# Patient Record
Sex: Male | Born: 1935
Health system: Southern US, Community
[De-identification: ages and names within clinical notes are randomized; demographics above are authoritative.]

## PROBLEM LIST (undated history)

## (undated) DIAGNOSIS — S48919A Complete traumatic amputation of unspecified shoulder and upper arm, level unspecified, initial encounter: Secondary | ICD-10-CM

## (undated) DIAGNOSIS — Z9001 Acquired absence of eye: Secondary | ICD-10-CM

## (undated) DIAGNOSIS — N189 Chronic kidney disease, unspecified: Secondary | ICD-10-CM

## (undated) DIAGNOSIS — R011 Cardiac murmur, unspecified: Secondary | ICD-10-CM

## (undated) DIAGNOSIS — I10 Essential (primary) hypertension: Secondary | ICD-10-CM

## (undated) HISTORY — PX: TONSILECTOMY, ADENOIDECTOMY, BILATERAL MYRINGOTOMY AND TUBES: SHX2538

## (undated) HISTORY — PX: OTHER SURGICAL HISTORY: SHX169

## (undated) HISTORY — PX: APPENDECTOMY: SHX54

## (undated) HISTORY — DX: Complete traumatic amputation of unspecified shoulder and upper arm, level unspecified, initial encounter: S48.919A

## (undated) HISTORY — DX: Cardiac murmur, unspecified: R01.1

## (undated) HISTORY — DX: Chronic kidney disease, unspecified: N18.9

## (undated) HISTORY — DX: Essential (primary) hypertension: I10

## (undated) HISTORY — DX: Acquired absence of eye: Z90.01

---

## 1998-05-19 ENCOUNTER — Ambulatory Visit (HOSPITAL_COMMUNITY): Admission: RE | Admit: 1998-05-19 | Discharge: 1998-05-19 | Payer: Self-pay | Admitting: Family Medicine

## 2002-03-22 ENCOUNTER — Encounter: Payer: Self-pay | Admitting: Family Medicine

## 2002-03-22 ENCOUNTER — Encounter: Admission: RE | Admit: 2002-03-22 | Discharge: 2002-03-22 | Payer: Self-pay | Admitting: Family Medicine

## 2008-09-24 ENCOUNTER — Emergency Department (HOSPITAL_COMMUNITY): Admission: EM | Admit: 2008-09-24 | Discharge: 2008-09-24 | Payer: Self-pay | Admitting: Emergency Medicine

## 2009-10-25 ENCOUNTER — Ambulatory Visit: Payer: Self-pay | Admitting: Cardiovascular Disease

## 2010-04-30 ENCOUNTER — Ambulatory Visit (INDEPENDENT_AMBULATORY_CARE_PROVIDER_SITE_OTHER): Payer: Medicare Other | Admitting: Cardiovascular Disease

## 2010-04-30 DIAGNOSIS — I119 Hypertensive heart disease without heart failure: Secondary | ICD-10-CM

## 2010-04-30 DIAGNOSIS — E7800 Pure hypercholesterolemia, unspecified: Secondary | ICD-10-CM

## 2011-05-14 ENCOUNTER — Other Ambulatory Visit: Payer: Self-pay | Admitting: Cardiovascular Disease

## 2011-05-22 DIAGNOSIS — I10 Essential (primary) hypertension: Secondary | ICD-10-CM | POA: Insufficient documentation

## 2011-05-22 DIAGNOSIS — Z9001 Acquired absence of eye: Secondary | ICD-10-CM | POA: Insufficient documentation

## 2011-05-22 DIAGNOSIS — R011 Cardiac murmur, unspecified: Secondary | ICD-10-CM

## 2011-05-22 DIAGNOSIS — IMO0001 Reserved for inherently not codable concepts without codable children: Secondary | ICD-10-CM | POA: Insufficient documentation

## 2011-05-22 DIAGNOSIS — N289 Disorder of kidney and ureter, unspecified: Secondary | ICD-10-CM

## 2011-05-22 DIAGNOSIS — S48919A Complete traumatic amputation of unspecified shoulder and upper arm, level unspecified, initial encounter: Secondary | ICD-10-CM

## 2011-06-20 ENCOUNTER — Other Ambulatory Visit: Payer: Self-pay | Admitting: Cardiovascular Disease

## 2011-06-20 NOTE — Telephone Encounter (Signed)
Refilled carvedilol one time

## 2011-07-24 ENCOUNTER — Encounter: Payer: Self-pay | Admitting: Cardiovascular Disease

## 2011-07-24 ENCOUNTER — Ambulatory Visit (INDEPENDENT_AMBULATORY_CARE_PROVIDER_SITE_OTHER): Payer: Medicare Other | Admitting: Cardiovascular Disease

## 2011-07-24 ENCOUNTER — Other Ambulatory Visit: Payer: Self-pay

## 2011-07-24 VITALS — BP 132/68 | HR 54 | Ht 70.0 in | Wt 186.8 lb

## 2011-07-24 DIAGNOSIS — I10 Essential (primary) hypertension: Secondary | ICD-10-CM

## 2011-07-24 DIAGNOSIS — E785 Hyperlipidemia, unspecified: Secondary | ICD-10-CM

## 2011-07-24 LAB — BASIC METABOLIC PANEL
CO2: 26 mEq/L (ref 19–32)
Calcium: 9.6 mg/dL (ref 8.4–10.5)
Creatinine, Ser: 1.2 mg/dL (ref 0.4–1.5)
Glucose, Bld: 142 mg/dL — ABNORMAL HIGH (ref 70–99)

## 2011-07-24 LAB — HEPATIC FUNCTION PANEL
AST: 22 U/L (ref 0–37)
Total Bilirubin: 0.8 mg/dL (ref 0.3–1.2)

## 2011-07-24 LAB — LIPID PANEL
HDL: 40.8 mg/dL (ref >39.00)
Total CHOL/HDL Ratio: 3

## 2011-07-24 MED ORDER — CARVEDILOL 12.5 MG PO TABS: 12.5000 mg | ORAL_TABLET | Freq: Two times a day (BID) | ORAL | Status: DC

## 2011-07-24 MED ORDER — SIMVASTATIN 40 MG PO TABS: 40.0000 mg | ORAL_TABLET | Freq: Every day | ORAL | Status: DC

## 2011-07-24 MED ORDER — SIMVASTATIN 80 MG PO TABS: 80.0000 mg | ORAL_TABLET | Freq: Every day | ORAL | Status: DC

## 2011-07-24 MED ORDER — LOSARTAN POTASSIUM-HCTZ 100-25 MG PO TABS: 1.0000 | ORAL_TABLET | Freq: Every day | ORAL | Status: DC

## 2011-07-24 NOTE — Assessment & Plan Note (Signed)
Ray Walters is doing fairly well. His blood pressure is a little elevated today. We'll give him some instructions on at the [facility] diet. We'll continue the same medications.  I'll see him again in 6 months. We'll check labs on him at that time.
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
## 2011-07-24 NOTE — Assessment & Plan Note (Signed)
He is still on simvastatin 80 mg a day. We will decrease his dose to 40 mg a day since that is now the highest recommended daily dose of simvastatin.

## 2011-07-24 NOTE — Patient Instructions (Signed)
Your physician wants you to follow-up in: 6 months with Dr. Melburn Popper.  You will receive a reminder letter in the mail two months in advance. If you don't receive a letter, please call our office to schedule the follow-up appointment.\  Decrease Simvastatin to 40mg  daily.  Labs today and in 6 months.  DASH Diet The DASH diet stands for "Dietary Approaches to Stop Hypertension." It is a healthy eating plan that has been shown to reduce high blood pressure (hypertension) in as little as 14 days, while also possibly providing other significant health benefits. These other health benefits include reducing the risk of breast cancer after menopause and reducing the risk of type 2 diabetes, heart disease, colon cancer, and stroke. Health benefits also include weight loss and slowing kidney failure in patients with chronic kidney disease.  DIET GUIDELINES  Limit salt (sodium). Your diet should contain less than 1500 mg of sodium daily.   Limit refined or processed carbohydrates. Your diet should include mostly whole grains. Desserts and added sugars should be used sparingly.   Include small amounts of heart-healthy fats. These types of fats include nuts, oils, and tub margarine. Limit saturated and trans fats. These fats have been shown to be harmful in the body.  CHOOSING FOODS  The following food groups are based on a 2000 calorie diet. See your Registered Dietitian for individual calorie needs. Grains and Grain Products (6 to 8 servings daily)  Eat More Often: Whole-wheat bread, brown rice, whole-grain or wheat pasta, quinoa, popcorn without added fat or salt (air popped).   Eat Less Often: White bread, white pasta, white rice, cornbread.  Vegetables (4 to 5 servings daily)  Eat More Often: Fresh, frozen, and canned vegetables. Vegetables may be raw, steamed, roasted, or grilled with a minimal amount of fat.   Eat Less Often/Avoid: Creamed or fried vegetables. Vegetables in a cheese sauce.  Fruit  (4 to 5 servings daily)  Eat More Often: All fresh, canned (in natural juice), or frozen fruits. Dried fruits without added sugar. One hundred percent fruit juice ( cup [237 mL] daily).   Eat Less Often: Dried fruits with added sugar. Canned fruit in light or heavy syrup.  [employer], Fish, and Poultry (2 servings or less daily. One serving is 3 to 4 oz [85-114 g]).  Eat More Often: Ninety percent or leaner ground beef, tenderloin, sirloin. Round cuts of beef, chicken breast, [place] breast. All fish. Grill, bake, or broil your meat. Nothing should be fried.   Eat Less Often/Avoid: Fatty cuts of meat, [place], or chicken leg, thigh, or wing. Fried cuts of meat or fish.  Dairy (2 to 3 servings)  Eat More Often: Low-fat or fat-free milk, low-fat plain or light yogurt, reduced-fat or part-skim cheese.   Eat Less Often/Avoid: Milk (whole, 2%, skim, or chocolate).Whole milk yogurt. Full-fat cheeses.  Nuts, Seeds, and Legumes (4 to 5 servings per week)  Eat More Often: All without added salt.   Eat Less Often/Avoid: Salted nuts and seeds, canned beans with added salt.  Fats and Sweets (limited)  Eat More Often: Vegetable oils, tub margarines without trans fats, sugar-free gelatin. Mayonnaise and salad dressings.   Eat Less Often/Avoid: Coconut oils, palm oils, butter, stick margarine, cream, half and half, cookies, candy, pie.  FOR MORE INFORMATION The Dash Diet Eating Plan: www.dashdiet.org Document Released: 02/14/2011 Document Reviewed: 02/04/2011 [facility] Patient Information 2012 [place], [place].

## 2011-08-14 ENCOUNTER — Encounter: Payer: Self-pay | Admitting: Cardiovascular Disease

## 2012-07-15 ENCOUNTER — Other Ambulatory Visit: Payer: Self-pay | Admitting: *Deleted

## 2012-07-15 MED ORDER — LOSARTAN POTASSIUM-HCTZ 100-25 MG PO TABS: 1.0000 | ORAL_TABLET | Freq: Every day | ORAL | Status: DC

## 2012-07-15 MED ORDER — SIMVASTATIN 40 MG PO TABS: 40.0000 mg | ORAL_TABLET | Freq: Every day | ORAL | Status: DC

## 2012-07-15 NOTE — Telephone Encounter (Signed)
Fax Received. Refill Completed. Ray Walters (R.M.A)   

## 2012-09-09 ENCOUNTER — Ambulatory Visit (INDEPENDENT_AMBULATORY_CARE_PROVIDER_SITE_OTHER): Payer: Medicare Other | Admitting: Cardiovascular Disease

## 2012-09-09 ENCOUNTER — Encounter: Payer: Self-pay | Admitting: Cardiovascular Disease

## 2012-09-09 VITALS — BP 138/76 | HR 64 | Ht 70.0 in | Wt 189.4 lb

## 2012-09-09 DIAGNOSIS — I10 Essential (primary) hypertension: Secondary | ICD-10-CM

## 2012-09-09 MED ORDER — LOSARTAN POTASSIUM-HCTZ 100-25 MG PO TABS: 1.0000 | ORAL_TABLET | Freq: Every day | ORAL | Status: DC

## 2012-09-09 MED ORDER — CARVEDILOL 12.5 MG PO TABS: 12.5000 mg | ORAL_TABLET | Freq: Two times a day (BID) | ORAL | Status: AC
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
## 2012-09-09 NOTE — Assessment & Plan Note (Signed)
Ray Walters is doing well.  No CP , breathing is good.  He has been watching his salt and is exercising regularly.  Will see him in 1 year for OV, labs, ECG.

## 2012-09-09 NOTE — Patient Instructions (Addendum)
Your physician wants you to follow-up in: 1 YEAR You will receive a reminder letter in the mail two months in advance. If you don't receive a letter, please call our office to schedule the follow-up appointment.  Your physician recommends that you continue on your current medications as directed. Please refer to the Current Medication list given to you today.   Your physician recommends that you return for a FASTING lipid profile: 1 YEAR  

## 2013-05-07 ENCOUNTER — Other Ambulatory Visit: Payer: Self-pay | Admitting: Cardiovascular Disease

## 2013-05-25 ENCOUNTER — Other Ambulatory Visit: Payer: Self-pay | Admitting: *Deleted

## 2013-05-25 MED ORDER — LOSARTAN POTASSIUM-HCTZ 100-25 MG PO TABS: 1.0000 | ORAL_TABLET | Freq: Every day | ORAL | Status: AC

## 2013-09-17 ENCOUNTER — Ambulatory Visit (INDEPENDENT_AMBULATORY_CARE_PROVIDER_SITE_OTHER): Payer: Medicare HMO | Admitting: Cardiovascular Disease

## 2013-09-17 ENCOUNTER — Encounter: Payer: Self-pay | Admitting: Cardiovascular Disease

## 2013-09-17 VITALS — BP 138/72 | HR 64 | Ht 70.0 in | Wt 189.2 lb

## 2013-09-17 DIAGNOSIS — E785 Hyperlipidemia, unspecified: Secondary | ICD-10-CM

## 2013-09-17 DIAGNOSIS — I10 Essential (primary) hypertension: Secondary | ICD-10-CM

## 2013-09-17 NOTE — Patient Instructions (Signed)
Your physician recommends that you continue on your current medications as directed. Please refer to the Current Medication list given to you today.  Your physician wants you to follow-up in: 1 year with Dr. Nahser.  You will receive a reminder letter in the mail two months in advance. If you don't receive a letter, please call our office to schedule the follow-up appointment.  
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
## 2013-09-17 NOTE — Assessment & Plan Note (Signed)
His blood pressure seems to be fairly well controlled. Continue same medications.

## 2015-01-03 DIAGNOSIS — E119 Type 2 diabetes mellitus without complications: Secondary | ICD-10-CM | POA: Diagnosis not present

## 2015-01-30 DIAGNOSIS — E119 Type 2 diabetes mellitus without complications: Secondary | ICD-10-CM | POA: Diagnosis not present

## 2015-01-30 DIAGNOSIS — E559 Vitamin D deficiency, unspecified: Secondary | ICD-10-CM | POA: Diagnosis not present

## 2015-01-30 DIAGNOSIS — R7989 Other specified abnormal findings of blood chemistry: Secondary | ICD-10-CM | POA: Diagnosis not present

## 2015-01-30 DIAGNOSIS — E290 Testicular hyperfunction: Secondary | ICD-10-CM | POA: Diagnosis not present

## 2015-01-30 DIAGNOSIS — R972 Elevated prostate specific antigen [PSA]: Secondary | ICD-10-CM | POA: Diagnosis not present

## 2015-01-30 DIAGNOSIS — R5383 Other fatigue: Secondary | ICD-10-CM | POA: Diagnosis not present

## 2015-02-07 DIAGNOSIS — E119 Type 2 diabetes mellitus without complications: Secondary | ICD-10-CM | POA: Diagnosis not present

## 2015-02-21 DIAGNOSIS — Z1389 Encounter for screening for other disorder: Secondary | ICD-10-CM | POA: Diagnosis not present

## 2015-02-21 DIAGNOSIS — I10 Essential (primary) hypertension: Secondary | ICD-10-CM | POA: Diagnosis not present

## 2015-02-21 DIAGNOSIS — E785 Hyperlipidemia, unspecified: Secondary | ICD-10-CM | POA: Diagnosis not present

## 2015-02-21 DIAGNOSIS — Z7984 Long term (current) use of oral hypoglycemic drugs: Secondary | ICD-10-CM | POA: Diagnosis not present

## 2015-02-21 DIAGNOSIS — Z0000 Encounter for general adult medical examination without abnormal findings: Secondary | ICD-10-CM | POA: Diagnosis not present

## 2015-02-21 DIAGNOSIS — E1122 Type 2 diabetes mellitus with diabetic chronic kidney disease: Secondary | ICD-10-CM | POA: Diagnosis not present

## 2015-02-21 DIAGNOSIS — N183 Chronic kidney disease, stage 3 (moderate): Secondary | ICD-10-CM | POA: Diagnosis not present

## 2015-04-26 DIAGNOSIS — E119 Type 2 diabetes mellitus without complications: Secondary | ICD-10-CM | POA: Diagnosis not present

## 2015-05-02 DIAGNOSIS — E782 Mixed hyperlipidemia: Secondary | ICD-10-CM | POA: Diagnosis not present

## 2015-05-02 DIAGNOSIS — E119 Type 2 diabetes mellitus without complications: Secondary | ICD-10-CM | POA: Diagnosis not present

## 2015-05-02 DIAGNOSIS — E559 Vitamin D deficiency, unspecified: Secondary | ICD-10-CM | POA: Diagnosis not present

## 2015-05-02 DIAGNOSIS — R7989 Other specified abnormal findings of blood chemistry: Secondary | ICD-10-CM | POA: Diagnosis not present

## 2015-05-04 DIAGNOSIS — E119 Type 2 diabetes mellitus without complications: Secondary | ICD-10-CM | POA: Diagnosis not present

## 2015-05-15 DIAGNOSIS — H353121 Nonexudative age-related macular degeneration, left eye, early dry stage: Secondary | ICD-10-CM | POA: Diagnosis not present

## 2015-05-15 DIAGNOSIS — H43812 Vitreous degeneration, left eye: Secondary | ICD-10-CM | POA: Diagnosis not present

## 2015-05-15 DIAGNOSIS — E119 Type 2 diabetes mellitus without complications: Secondary | ICD-10-CM | POA: Diagnosis not present

## 2015-05-25 DIAGNOSIS — E785 Hyperlipidemia, unspecified: Secondary | ICD-10-CM | POA: Diagnosis not present

## 2015-05-25 DIAGNOSIS — E119 Type 2 diabetes mellitus without complications: Secondary | ICD-10-CM | POA: Diagnosis not present

## 2015-05-25 DIAGNOSIS — I10 Essential (primary) hypertension: Secondary | ICD-10-CM | POA: Diagnosis not present

## 2015-05-30 ENCOUNTER — Emergency Department (HOSPITAL_BASED_OUTPATIENT_CLINIC_OR_DEPARTMENT_OTHER): Payer: Medicare HMO

## 2015-05-30 ENCOUNTER — Encounter (HOSPITAL_BASED_OUTPATIENT_CLINIC_OR_DEPARTMENT_OTHER): Payer: Self-pay | Admitting: Emergency Medicine

## 2015-05-30 ENCOUNTER — Other Ambulatory Visit: Payer: Self-pay

## 2015-05-30 ENCOUNTER — Emergency Department (HOSPITAL_BASED_OUTPATIENT_CLINIC_OR_DEPARTMENT_OTHER)
Admission: EM | Admit: 2015-05-30 | Discharge: 2015-05-31 | Disposition: A | Discharge status: Home / Self Care | Payer: Medicare HMO | Attending: Physician Assistant | Admitting: Physician Assistant

## 2015-05-30 DIAGNOSIS — H5441 Blindness, right eye, normal vision left eye: Secondary | ICD-10-CM | POA: Diagnosis not present

## 2015-05-30 DIAGNOSIS — R42 Dizziness and giddiness: Secondary | ICD-10-CM | POA: Diagnosis not present

## 2015-05-30 DIAGNOSIS — R7989 Other specified abnormal findings of blood chemistry: Secondary | ICD-10-CM

## 2015-05-30 DIAGNOSIS — Z7984 Long term (current) use of oral hypoglycemic drugs: Secondary | ICD-10-CM | POA: Insufficient documentation

## 2015-05-30 DIAGNOSIS — Z79899 Other long term (current) drug therapy: Secondary | ICD-10-CM | POA: Insufficient documentation

## 2015-05-30 DIAGNOSIS — I129 Hypertensive chronic kidney disease with stage 1 through stage 4 chronic kidney disease, or unspecified chronic kidney disease: Secondary | ICD-10-CM | POA: Insufficient documentation

## 2015-05-30 DIAGNOSIS — N189 Chronic kidney disease, unspecified: Secondary | ICD-10-CM | POA: Diagnosis not present

## 2015-05-30 DIAGNOSIS — Z7982 Long term (current) use of aspirin: Secondary | ICD-10-CM | POA: Insufficient documentation

## 2015-05-30 DIAGNOSIS — E876 Hypokalemia: Secondary | ICD-10-CM | POA: Insufficient documentation

## 2015-05-30 DIAGNOSIS — E119 Type 2 diabetes mellitus without complications: Secondary | ICD-10-CM | POA: Diagnosis not present

## 2015-05-30 DIAGNOSIS — Z87828 Personal history of other (healed) physical injury and trauma: Secondary | ICD-10-CM | POA: Diagnosis not present

## 2015-05-30 DIAGNOSIS — R011 Cardiac murmur, unspecified: Secondary | ICD-10-CM | POA: Insufficient documentation

## 2015-05-30 DIAGNOSIS — Z87891 Personal history of nicotine dependence: Secondary | ICD-10-CM | POA: Insufficient documentation

## 2015-05-30 LAB — BASIC METABOLIC PANEL
Anion gap: 10 (ref 5–15)
BUN: 27 mg/dL — AB (ref 6–20)
CHLORIDE: 101 mmol/L (ref 101–111)
CO2: 23 mmol/L (ref 22–32)
CREATININE: 1.72 mg/dL — AB (ref 0.61–1.24)
Calcium: 9.1 mg/dL (ref 8.9–10.3)
GFR calc Af Amer: 40 mL/min — ABNORMAL LOW (ref >60)
GFR calc non Af Amer: 34 mL/min — ABNORMAL LOW (ref >60)
GLUCOSE: 139 mg/dL — AB (ref 65–99)
Potassium: 3.3 mmol/L — ABNORMAL LOW (ref 3.5–5.1)
SODIUM: 134 mmol/L — AB (ref 135–145)

## 2015-05-30 LAB — CBC WITH DIFFERENTIAL/PLATELET
Basophils Absolute: 0 10*3/uL (ref 0.0–0.1)
Basophils Relative: 0 %
EOS ABS: 0.1 10*3/uL (ref 0.0–0.7)
Eosinophils Relative: 2 %
HEMATOCRIT: 36.1 % — AB (ref 39.0–52.0)
HEMOGLOBIN: 13 g/dL (ref 13.0–17.0)
LYMPHS ABS: 1.9 10*3/uL (ref 0.7–4.0)
Lymphocytes Relative: 30 %
MCH: 32.9 pg (ref 26.0–34.0)
MCHC: 36 g/dL (ref 30.0–36.0)
MCV: 91.4 fL (ref 78.0–100.0)
MONOS PCT: 7 %
Monocytes Absolute: 0.5 10*3/uL (ref 0.1–1.0)
NEUTROS PCT: 61 %
Neutro Abs: 3.8 10*3/uL (ref 1.7–7.7)
Platelets: 174 10*3/uL (ref 150–400)
RBC: 3.95 MIL/uL — ABNORMAL LOW (ref 4.22–5.81)
RDW: 11.7 % (ref 11.5–15.5)
WBC: 6.3 10*3/uL (ref 4.0–10.5)

## 2015-05-30 LAB — URINALYSIS, ROUTINE W REFLEX MICROSCOPIC
Bilirubin Urine: NEGATIVE
GLUCOSE, UA: NEGATIVE mg/dL
HGB URINE DIPSTICK: NEGATIVE
Ketones, ur: NEGATIVE mg/dL
Leukocytes, UA: NEGATIVE
Nitrite: NEGATIVE
PH: 5.5 (ref 5.0–8.0)
Protein, ur: NEGATIVE mg/dL
SPECIFIC GRAVITY, URINE: 1.008 (ref 1.005–1.030)

## 2015-05-30 LAB — TROPONIN I: Troponin I: <0.03 ng/mL (ref <0.031)

## 2015-05-30 NOTE — ED Notes (Signed)
Pt states he was working out earlier today and afterward felt weird. Took BP and it was low. Called PCP and was told to come to ED.
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
## 2015-05-30 NOTE — ED Notes (Signed)
Pt had episode of dizziness at 3:30 this afternoon after working out at the gym.  Took his bp when he got home and sts it was 105/35.  Episode passed in about 10 minutes and then it passed.  Called his pmd and they sent him to the Ed to be checked.  No untoward sx at this time.

## 2015-05-31 DIAGNOSIS — R42 Dizziness and giddiness: Secondary | ICD-10-CM | POA: Diagnosis not present

## 2015-05-31 DIAGNOSIS — Z87891 Personal history of nicotine dependence: Secondary | ICD-10-CM | POA: Diagnosis not present

## 2015-05-31 DIAGNOSIS — N189 Chronic kidney disease, unspecified: Secondary | ICD-10-CM | POA: Diagnosis not present

## 2015-05-31 DIAGNOSIS — H5441 Blindness, right eye, normal vision left eye: Secondary | ICD-10-CM | POA: Diagnosis not present

## 2015-05-31 DIAGNOSIS — E119 Type 2 diabetes mellitus without complications: Secondary | ICD-10-CM | POA: Diagnosis not present

## 2015-05-31 DIAGNOSIS — I129 Hypertensive chronic kidney disease with stage 1 through stage 4 chronic kidney disease, or unspecified chronic kidney disease: Secondary | ICD-10-CM | POA: Diagnosis not present

## 2015-05-31 DIAGNOSIS — E876 Hypokalemia: Secondary | ICD-10-CM | POA: Diagnosis not present

## 2015-05-31 DIAGNOSIS — R011 Cardiac murmur, unspecified: Secondary | ICD-10-CM | POA: Diagnosis not present

## 2015-05-31 DIAGNOSIS — Z7984 Long term (current) use of oral hypoglycemic drugs: Secondary | ICD-10-CM | POA: Diagnosis not present

## 2015-05-31 DIAGNOSIS — Z7982 Long term (current) use of aspirin: Secondary | ICD-10-CM | POA: Diagnosis not present

## 2015-05-31 MED ORDER — POTASSIUM CHLORIDE CRYS ER 20 MEQ PO TBCR
20.0000 meq | EXTENDED_RELEASE_TABLET | Freq: Once | ORAL | Status: AC
  Administered 2015-05-31: 20 meq via ORAL
  Filled 2015-05-31: qty 1

## 2015-05-31 NOTE — Discharge Instructions (Signed)
Recommend to increase fluids at home.  Follow-up with your primary care physician, I do recommend that you get your kidney function rechecked as it was slightly elevated today as we discussed. If you plan to resume exercising, recommend to take it slow. Stop if you begin to feel lightheaded or dizzy again. Return here for any new or worsening symptoms.

## 2015-06-02 DIAGNOSIS — R001 Bradycardia, unspecified: Secondary | ICD-10-CM | POA: Diagnosis not present

## 2015-06-02 DIAGNOSIS — N289 Disorder of kidney and ureter, unspecified: Secondary | ICD-10-CM | POA: Diagnosis not present

## 2015-06-02 DIAGNOSIS — R42 Dizziness and giddiness: Secondary | ICD-10-CM | POA: Diagnosis not present

## 2015-06-02 DIAGNOSIS — I10 Essential (primary) hypertension: Secondary | ICD-10-CM | POA: Diagnosis not present

## 2015-06-13 DIAGNOSIS — D485 Neoplasm of uncertain behavior of skin: Secondary | ICD-10-CM | POA: Diagnosis not present

## 2015-06-13 DIAGNOSIS — D044 Carcinoma in situ of skin of scalp and neck: Secondary | ICD-10-CM | POA: Diagnosis not present

## 2015-06-13 DIAGNOSIS — L570 Actinic keratosis: Secondary | ICD-10-CM | POA: Diagnosis not present

## 2015-06-13 DIAGNOSIS — Z85828 Personal history of other malignant neoplasm of skin: Secondary | ICD-10-CM | POA: Diagnosis not present

## 2015-06-13 DIAGNOSIS — L821 Other seborrheic keratosis: Secondary | ICD-10-CM | POA: Diagnosis not present

## 2015-06-13 DIAGNOSIS — L723 Sebaceous cyst: Secondary | ICD-10-CM | POA: Diagnosis not present

## 2015-06-26 DIAGNOSIS — Z961 Presence of intraocular lens: Secondary | ICD-10-CM | POA: Diagnosis not present

## 2015-06-26 DIAGNOSIS — H43812 Vitreous degeneration, left eye: Secondary | ICD-10-CM | POA: Diagnosis not present

## 2015-06-26 DIAGNOSIS — H353121 Nonexudative age-related macular degeneration, left eye, early dry stage: Secondary | ICD-10-CM | POA: Diagnosis not present

## 2015-07-10 DIAGNOSIS — 419620001 Death: Secondary | SNOMED CT | POA: Diagnosis not present

## 2015-07-10 DEATH — deceased

## 2015-07-20 DIAGNOSIS — R7989 Other specified abnormal findings of blood chemistry: Secondary | ICD-10-CM | POA: Diagnosis not present

## 2015-07-20 DIAGNOSIS — E729 Disorder of amino-acid metabolism, unspecified: Secondary | ICD-10-CM | POA: Diagnosis not present

## 2015-07-20 DIAGNOSIS — E119 Type 2 diabetes mellitus without complications: Secondary | ICD-10-CM | POA: Diagnosis not present

## 2015-07-20 DIAGNOSIS — E559 Vitamin D deficiency, unspecified: Secondary | ICD-10-CM | POA: Diagnosis not present

## 2015-07-20 DIAGNOSIS — E782 Mixed hyperlipidemia: Secondary | ICD-10-CM | POA: Diagnosis not present

## 2015-07-25 DIAGNOSIS — R21 Rash and other nonspecific skin eruption: Secondary | ICD-10-CM | POA: Diagnosis not present

## 2015-07-25 DIAGNOSIS — E119 Type 2 diabetes mellitus without complications: Secondary | ICD-10-CM | POA: Diagnosis not present

## 2015-08-01 DIAGNOSIS — R21 Rash and other nonspecific skin eruption: Secondary | ICD-10-CM | POA: Diagnosis not present

## 2015-08-01 DIAGNOSIS — E119 Type 2 diabetes mellitus without complications: Secondary | ICD-10-CM | POA: Diagnosis not present

## 2015-09-19 DIAGNOSIS — E119 Type 2 diabetes mellitus without complications: Secondary | ICD-10-CM | POA: Diagnosis not present

## 2015-10-04 DIAGNOSIS — D099 Carcinoma in situ, unspecified: Secondary | ICD-10-CM | POA: Diagnosis not present

## 2015-10-04 DIAGNOSIS — L570 Actinic keratosis: Secondary | ICD-10-CM | POA: Diagnosis not present

## 2015-11-09 DIAGNOSIS — E559 Vitamin D deficiency, unspecified: Secondary | ICD-10-CM | POA: Diagnosis not present

## 2015-11-09 DIAGNOSIS — R5383 Other fatigue: Secondary | ICD-10-CM | POA: Diagnosis not present

## 2015-11-09 DIAGNOSIS — E119 Type 2 diabetes mellitus without complications: Secondary | ICD-10-CM | POA: Diagnosis not present

## 2015-11-09 DIAGNOSIS — R7989 Other specified abnormal findings of blood chemistry: Secondary | ICD-10-CM | POA: Diagnosis not present

## 2015-11-16 DIAGNOSIS — E119 Type 2 diabetes mellitus without complications: Secondary | ICD-10-CM | POA: Diagnosis not present

## 2015-12-06 DIAGNOSIS — I10 Essential (primary) hypertension: Secondary | ICD-10-CM | POA: Diagnosis not present

## 2015-12-06 DIAGNOSIS — Z7984 Long term (current) use of oral hypoglycemic drugs: Secondary | ICD-10-CM | POA: Diagnosis not present

## 2015-12-06 DIAGNOSIS — E119 Type 2 diabetes mellitus without complications: Secondary | ICD-10-CM | POA: Diagnosis not present

## 2015-12-06 DIAGNOSIS — E785 Hyperlipidemia, unspecified: Secondary | ICD-10-CM | POA: Diagnosis not present

## 2016-01-03 DIAGNOSIS — L570 Actinic keratosis: Secondary | ICD-10-CM | POA: Diagnosis not present

## 2016-01-03 DIAGNOSIS — Z23 Encounter for immunization: Secondary | ICD-10-CM | POA: Diagnosis not present

## 2016-01-03 DIAGNOSIS — L723 Sebaceous cyst: Secondary | ICD-10-CM | POA: Diagnosis not present

## 2016-01-03 DIAGNOSIS — D225 Melanocytic nevi of trunk: Secondary | ICD-10-CM | POA: Diagnosis not present

## 2016-01-03 DIAGNOSIS — Z85828 Personal history of other malignant neoplasm of skin: Secondary | ICD-10-CM | POA: Diagnosis not present

## 2016-02-21 DIAGNOSIS — R5383 Other fatigue: Secondary | ICD-10-CM | POA: Diagnosis not present

## 2016-02-21 DIAGNOSIS — R7989 Other specified abnormal findings of blood chemistry: Secondary | ICD-10-CM | POA: Diagnosis not present

## 2016-02-21 DIAGNOSIS — E559 Vitamin D deficiency, unspecified: Secondary | ICD-10-CM | POA: Diagnosis not present

## 2016-02-23 DIAGNOSIS — E785 Hyperlipidemia, unspecified: Secondary | ICD-10-CM | POA: Diagnosis not present

## 2016-02-23 DIAGNOSIS — Z125 Encounter for screening for malignant neoplasm of prostate: Secondary | ICD-10-CM | POA: Diagnosis not present

## 2016-02-23 DIAGNOSIS — Z7984 Long term (current) use of oral hypoglycemic drugs: Secondary | ICD-10-CM | POA: Diagnosis not present

## 2016-02-23 DIAGNOSIS — E119 Type 2 diabetes mellitus without complications: Secondary | ICD-10-CM | POA: Diagnosis not present

## 2016-02-23 DIAGNOSIS — Z1211 Encounter for screening for malignant neoplasm of colon: Secondary | ICD-10-CM | POA: Diagnosis not present

## 2016-02-23 DIAGNOSIS — Z0000 Encounter for general adult medical examination without abnormal findings: Secondary | ICD-10-CM | POA: Diagnosis not present

## 2016-02-23 DIAGNOSIS — I10 Essential (primary) hypertension: Secondary | ICD-10-CM | POA: Diagnosis not present

## 2016-02-25 DIAGNOSIS — E119 Type 2 diabetes mellitus without complications: Secondary | ICD-10-CM | POA: Diagnosis not present

## 2016-02-28 DIAGNOSIS — E119 Type 2 diabetes mellitus without complications: Secondary | ICD-10-CM | POA: Diagnosis not present

## 2016-03-08 DIAGNOSIS — S0502XA Injury of conjunctiva and corneal abrasion without foreign body, left eye, initial encounter: Secondary | ICD-10-CM | POA: Diagnosis not present

## 2016-03-12 DIAGNOSIS — H43812 Vitreous degeneration, left eye: Secondary | ICD-10-CM | POA: Diagnosis not present

## 2016-03-12 DIAGNOSIS — S0502XD Injury of conjunctiva and corneal abrasion without foreign body, left eye, subsequent encounter: Secondary | ICD-10-CM | POA: Diagnosis not present

## 2016-03-12 DIAGNOSIS — H353121 Nonexudative age-related macular degeneration, left eye, early dry stage: Secondary | ICD-10-CM | POA: Diagnosis not present

## 2016-03-12 DIAGNOSIS — E119 Type 2 diabetes mellitus without complications: Secondary | ICD-10-CM | POA: Diagnosis not present

## 2016-05-01 DIAGNOSIS — E119 Type 2 diabetes mellitus without complications: Secondary | ICD-10-CM | POA: Diagnosis not present

## 2016-05-01 DIAGNOSIS — Z6825 Body mass index (BMI) 25.0-25.9, adult: Secondary | ICD-10-CM | POA: Diagnosis not present

## 2016-05-01 DIAGNOSIS — E785 Hyperlipidemia, unspecified: Secondary | ICD-10-CM | POA: Diagnosis not present

## 2016-05-14 DIAGNOSIS — Z23 Encounter for immunization: Secondary | ICD-10-CM | POA: Diagnosis not present

## 2016-05-14 DIAGNOSIS — L723 Sebaceous cyst: Secondary | ICD-10-CM | POA: Diagnosis not present

## 2016-05-14 DIAGNOSIS — L570 Actinic keratosis: Secondary | ICD-10-CM | POA: Diagnosis not present

## 2016-05-22 DIAGNOSIS — L905 Scar conditions and fibrosis of skin: Secondary | ICD-10-CM | POA: Diagnosis not present

## 2016-05-22 DIAGNOSIS — H5461 Unqualified visual loss, right eye, normal vision left eye: Secondary | ICD-10-CM | POA: Diagnosis not present

## 2016-05-22 DIAGNOSIS — Z79899 Other long term (current) drug therapy: Secondary | ICD-10-CM | POA: Diagnosis not present

## 2016-05-22 DIAGNOSIS — Z87891 Personal history of nicotine dependence: Secondary | ICD-10-CM | POA: Diagnosis not present

## 2016-05-22 DIAGNOSIS — Z9089 Acquired absence of other organs: Secondary | ICD-10-CM | POA: Diagnosis not present

## 2016-05-22 DIAGNOSIS — E785 Hyperlipidemia, unspecified: Secondary | ICD-10-CM | POA: Diagnosis not present

## 2016-05-22 DIAGNOSIS — I10 Essential (primary) hypertension: Secondary | ICD-10-CM | POA: Diagnosis not present

## 2016-05-22 DIAGNOSIS — E663 Overweight: Secondary | ICD-10-CM | POA: Diagnosis not present

## 2016-05-22 DIAGNOSIS — H9113 Presbycusis, bilateral: Secondary | ICD-10-CM | POA: Diagnosis not present

## 2016-05-22 DIAGNOSIS — Z87828 Personal history of other (healed) physical injury and trauma: Secondary | ICD-10-CM | POA: Diagnosis not present

## 2016-05-22 DIAGNOSIS — Z974 Presence of external hearing-aid: Secondary | ICD-10-CM | POA: Diagnosis not present

## 2016-05-22 DIAGNOSIS — Z9001 Acquired absence of eye: Secondary | ICD-10-CM | POA: Diagnosis not present

## 2016-05-22 DIAGNOSIS — Z9049 Acquired absence of other specified parts of digestive tract: Secondary | ICD-10-CM | POA: Diagnosis not present

## 2016-05-22 DIAGNOSIS — M79644 Pain in right finger(s): Secondary | ICD-10-CM | POA: Diagnosis not present

## 2016-05-22 DIAGNOSIS — D229 Melanocytic nevi, unspecified: Secondary | ICD-10-CM | POA: Diagnosis not present

## 2016-05-22 DIAGNOSIS — E119 Type 2 diabetes mellitus without complications: Secondary | ICD-10-CM | POA: Diagnosis not present

## 2016-05-22 DIAGNOSIS — Z0000 Encounter for general adult medical examination without abnormal findings: Secondary | ICD-10-CM | POA: Diagnosis not present

## 2016-05-22 DIAGNOSIS — Z89202 Acquired absence of left upper limb, unspecified level: Secondary | ICD-10-CM | POA: Diagnosis not present

## 2016-05-22 DIAGNOSIS — Z87448 Personal history of other diseases of urinary system: Secondary | ICD-10-CM | POA: Diagnosis not present

## 2016-06-05 DIAGNOSIS — E119 Type 2 diabetes mellitus without complications: Secondary | ICD-10-CM | POA: Diagnosis not present

## 2016-06-05 DIAGNOSIS — E559 Vitamin D deficiency, unspecified: Secondary | ICD-10-CM | POA: Diagnosis not present

## 2016-06-05 DIAGNOSIS — R7989 Other specified abnormal findings of blood chemistry: Secondary | ICD-10-CM | POA: Diagnosis not present

## 2016-06-05 DIAGNOSIS — R5383 Other fatigue: Secondary | ICD-10-CM | POA: Diagnosis not present

## 2016-06-12 DIAGNOSIS — E119 Type 2 diabetes mellitus without complications: Secondary | ICD-10-CM | POA: Diagnosis not present

## 2016-06-27 DIAGNOSIS — H43812 Vitreous degeneration, left eye: Secondary | ICD-10-CM | POA: Diagnosis not present

## 2016-06-27 DIAGNOSIS — Z961 Presence of intraocular lens: Secondary | ICD-10-CM | POA: Diagnosis not present

## 2016-06-27 DIAGNOSIS — H353121 Nonexudative age-related macular degeneration, left eye, early dry stage: Secondary | ICD-10-CM | POA: Diagnosis not present

## 2016-06-27 DIAGNOSIS — H1712 Central corneal opacity, left eye: Secondary | ICD-10-CM | POA: Diagnosis not present

## 2016-06-27 DIAGNOSIS — E119 Type 2 diabetes mellitus without complications: Secondary | ICD-10-CM | POA: Diagnosis not present

## 2016-07-16 DIAGNOSIS — L570 Actinic keratosis: Secondary | ICD-10-CM | POA: Diagnosis not present

## 2016-07-16 DIAGNOSIS — L723 Sebaceous cyst: Secondary | ICD-10-CM | POA: Diagnosis not present

## 2016-07-16 DIAGNOSIS — C44311 Basal cell carcinoma of skin of nose: Secondary | ICD-10-CM | POA: Diagnosis not present

## 2016-07-16 DIAGNOSIS — D485 Neoplasm of uncertain behavior of skin: Secondary | ICD-10-CM | POA: Diagnosis not present

## 2016-07-16 DIAGNOSIS — Z85828 Personal history of other malignant neoplasm of skin: Secondary | ICD-10-CM | POA: Diagnosis not present

## 2016-07-16 DIAGNOSIS — D225 Melanocytic nevi of trunk: Secondary | ICD-10-CM | POA: Diagnosis not present

## 2016-07-16 DIAGNOSIS — L821 Other seborrheic keratosis: Secondary | ICD-10-CM | POA: Diagnosis not present

## 2016-08-23 DIAGNOSIS — I10 Essential (primary) hypertension: Secondary | ICD-10-CM | POA: Diagnosis not present

## 2016-08-23 DIAGNOSIS — E785 Hyperlipidemia, unspecified: Secondary | ICD-10-CM | POA: Diagnosis not present

## 2016-08-23 DIAGNOSIS — E119 Type 2 diabetes mellitus without complications: Secondary | ICD-10-CM | POA: Diagnosis not present

## 2016-10-01 DIAGNOSIS — C44311 Basal cell carcinoma of skin of nose: Secondary | ICD-10-CM | POA: Diagnosis not present

## 2016-10-21 DIAGNOSIS — Z0100 Encounter for examination of eyes and vision without abnormal findings: Secondary | ICD-10-CM | POA: Diagnosis not present

## 2016-10-28 DIAGNOSIS — M9904 Segmental and somatic dysfunction of sacral region: Secondary | ICD-10-CM | POA: Diagnosis not present

## 2016-10-28 DIAGNOSIS — M9903 Segmental and somatic dysfunction of lumbar region: Secondary | ICD-10-CM | POA: Diagnosis not present

## 2016-10-28 DIAGNOSIS — M9905 Segmental and somatic dysfunction of pelvic region: Secondary | ICD-10-CM | POA: Diagnosis not present

## 2016-10-28 DIAGNOSIS — M5136 Other intervertebral disc degeneration, lumbar region: Secondary | ICD-10-CM | POA: Diagnosis not present

## 2016-10-31 DIAGNOSIS — M9903 Segmental and somatic dysfunction of lumbar region: Secondary | ICD-10-CM | POA: Diagnosis not present

## 2016-10-31 DIAGNOSIS — M9905 Segmental and somatic dysfunction of pelvic region: Secondary | ICD-10-CM | POA: Diagnosis not present

## 2016-10-31 DIAGNOSIS — M5136 Other intervertebral disc degeneration, lumbar region: Secondary | ICD-10-CM | POA: Diagnosis not present

## 2016-10-31 DIAGNOSIS — M9904 Segmental and somatic dysfunction of sacral region: Secondary | ICD-10-CM | POA: Diagnosis not present

## 2016-11-05 DIAGNOSIS — D485 Neoplasm of uncertain behavior of skin: Secondary | ICD-10-CM | POA: Diagnosis not present

## 2016-11-05 DIAGNOSIS — C44311 Basal cell carcinoma of skin of nose: Secondary | ICD-10-CM | POA: Diagnosis not present

## 2016-11-05 DIAGNOSIS — C4442 Squamous cell carcinoma of skin of scalp and neck: Secondary | ICD-10-CM | POA: Diagnosis not present

## 2016-11-05 DIAGNOSIS — L570 Actinic keratosis: Secondary | ICD-10-CM | POA: Diagnosis not present

## 2016-12-11 DIAGNOSIS — M9903 Segmental and somatic dysfunction of lumbar region: Secondary | ICD-10-CM | POA: Diagnosis not present

## 2016-12-11 DIAGNOSIS — M9905 Segmental and somatic dysfunction of pelvic region: Secondary | ICD-10-CM | POA: Diagnosis not present

## 2016-12-11 DIAGNOSIS — M9904 Segmental and somatic dysfunction of sacral region: Secondary | ICD-10-CM | POA: Diagnosis not present

## 2016-12-11 DIAGNOSIS — C4442 Squamous cell carcinoma of skin of scalp and neck: Secondary | ICD-10-CM | POA: Diagnosis not present

## 2016-12-11 DIAGNOSIS — M5136 Other intervertebral disc degeneration, lumbar region: Secondary | ICD-10-CM | POA: Diagnosis not present

## 2017-02-06 DIAGNOSIS — Z85828 Personal history of other malignant neoplasm of skin: Secondary | ICD-10-CM | POA: Diagnosis not present

## 2017-02-06 DIAGNOSIS — Z23 Encounter for immunization: Secondary | ICD-10-CM | POA: Diagnosis not present

## 2017-02-06 DIAGNOSIS — L918 Other hypertrophic disorders of the skin: Secondary | ICD-10-CM | POA: Diagnosis not present

## 2017-02-06 DIAGNOSIS — D225 Melanocytic nevi of trunk: Secondary | ICD-10-CM | POA: Diagnosis not present

## 2017-02-06 DIAGNOSIS — L570 Actinic keratosis: Secondary | ICD-10-CM | POA: Diagnosis not present

## 2017-02-06 DIAGNOSIS — L723 Sebaceous cyst: Secondary | ICD-10-CM | POA: Diagnosis not present

## 2017-02-10 DIAGNOSIS — E119 Type 2 diabetes mellitus without complications: Secondary | ICD-10-CM | POA: Diagnosis not present

## 2017-02-10 DIAGNOSIS — R7989 Other specified abnormal findings of blood chemistry: Secondary | ICD-10-CM | POA: Diagnosis not present

## 2017-02-10 DIAGNOSIS — E559 Vitamin D deficiency, unspecified: Secondary | ICD-10-CM | POA: Diagnosis not present

## 2017-02-10 DIAGNOSIS — R5383 Other fatigue: Secondary | ICD-10-CM | POA: Diagnosis not present

## 2017-02-19 DIAGNOSIS — E119 Type 2 diabetes mellitus without complications: Secondary | ICD-10-CM | POA: Diagnosis not present

## 2017-04-21 DIAGNOSIS — Z7984 Long term (current) use of oral hypoglycemic drugs: Secondary | ICD-10-CM | POA: Diagnosis not present

## 2017-04-21 DIAGNOSIS — E785 Hyperlipidemia, unspecified: Secondary | ICD-10-CM | POA: Diagnosis not present

## 2017-04-21 DIAGNOSIS — G3184 Mild cognitive impairment, so stated: Secondary | ICD-10-CM | POA: Diagnosis not present

## 2017-04-21 DIAGNOSIS — Z87891 Personal history of nicotine dependence: Secondary | ICD-10-CM | POA: Diagnosis not present

## 2017-04-21 DIAGNOSIS — I129 Hypertensive chronic kidney disease with stage 1 through stage 4 chronic kidney disease, or unspecified chronic kidney disease: Secondary | ICD-10-CM | POA: Diagnosis not present

## 2017-04-21 DIAGNOSIS — N529 Male erectile dysfunction, unspecified: Secondary | ICD-10-CM | POA: Diagnosis not present

## 2017-04-21 DIAGNOSIS — H547 Unspecified visual loss: Secondary | ICD-10-CM | POA: Diagnosis not present

## 2017-04-21 DIAGNOSIS — R7303 Prediabetes: Secondary | ICD-10-CM | POA: Diagnosis not present

## 2017-04-21 DIAGNOSIS — I499 Cardiac arrhythmia, unspecified: Secondary | ICD-10-CM | POA: Diagnosis not present

## 2017-04-21 DIAGNOSIS — N183 Chronic kidney disease, stage 3 (moderate): Secondary | ICD-10-CM | POA: Diagnosis not present

## 2017-04-23 DIAGNOSIS — D485 Neoplasm of uncertain behavior of skin: Secondary | ICD-10-CM | POA: Diagnosis not present

## 2017-04-23 DIAGNOSIS — C44529 Squamous cell carcinoma of skin of other part of trunk: Secondary | ICD-10-CM | POA: Diagnosis not present

## 2017-04-25 DIAGNOSIS — E119 Type 2 diabetes mellitus without complications: Secondary | ICD-10-CM | POA: Diagnosis not present

## 2017-04-25 DIAGNOSIS — R350 Frequency of micturition: Secondary | ICD-10-CM | POA: Diagnosis not present

## 2017-04-25 DIAGNOSIS — Z0000 Encounter for general adult medical examination without abnormal findings: Secondary | ICD-10-CM | POA: Diagnosis not present

## 2017-04-25 DIAGNOSIS — I10 Essential (primary) hypertension: Secondary | ICD-10-CM | POA: Diagnosis not present

## 2017-04-25 DIAGNOSIS — E785 Hyperlipidemia, unspecified: Secondary | ICD-10-CM | POA: Diagnosis not present

## 2017-05-12 DIAGNOSIS — E559 Vitamin D deficiency, unspecified: Secondary | ICD-10-CM | POA: Diagnosis not present

## 2017-05-12 DIAGNOSIS — E119 Type 2 diabetes mellitus without complications: Secondary | ICD-10-CM | POA: Diagnosis not present

## 2017-05-12 DIAGNOSIS — R5383 Other fatigue: Secondary | ICD-10-CM | POA: Diagnosis not present

## 2017-05-12 DIAGNOSIS — R7989 Other specified abnormal findings of blood chemistry: Secondary | ICD-10-CM | POA: Diagnosis not present

## 2017-05-13 DIAGNOSIS — I10 Essential (primary) hypertension: Secondary | ICD-10-CM | POA: Diagnosis not present

## 2017-05-13 DIAGNOSIS — E119 Type 2 diabetes mellitus without complications: Secondary | ICD-10-CM | POA: Diagnosis not present

## 2017-05-13 DIAGNOSIS — E785 Hyperlipidemia, unspecified: Secondary | ICD-10-CM | POA: Diagnosis not present

## 2017-05-20 DIAGNOSIS — E119 Type 2 diabetes mellitus without complications: Secondary | ICD-10-CM | POA: Diagnosis not present

## 2017-05-29 DIAGNOSIS — E119 Type 2 diabetes mellitus without complications: Secondary | ICD-10-CM | POA: Diagnosis not present

## 2017-05-29 DIAGNOSIS — T50905A Adverse effect of unspecified drugs, medicaments and biological substances, initial encounter: Secondary | ICD-10-CM | POA: Diagnosis not present

## 2017-05-29 DIAGNOSIS — R001 Bradycardia, unspecified: Secondary | ICD-10-CM | POA: Diagnosis not present

## 2017-05-29 DIAGNOSIS — I10 Essential (primary) hypertension: Secondary | ICD-10-CM | POA: Diagnosis not present

## 2017-05-30 DIAGNOSIS — R69 Illness, unspecified: Secondary | ICD-10-CM | POA: Diagnosis not present

## 2017-05-31 ENCOUNTER — Emergency Department (HOSPITAL_BASED_OUTPATIENT_CLINIC_OR_DEPARTMENT_OTHER): Payer: Medicare HMO

## 2017-05-31 ENCOUNTER — Other Ambulatory Visit: Payer: Self-pay

## 2017-05-31 ENCOUNTER — Emergency Department (HOSPITAL_BASED_OUTPATIENT_CLINIC_OR_DEPARTMENT_OTHER)
Admission: EM | Admit: 2017-05-31 | Discharge: 2017-05-31 | DRG: 310 | Discharge status: Against Medical Advice | Payer: Medicare HMO | Attending: Emergency Medicine | Admitting: Emergency Medicine

## 2017-05-31 ENCOUNTER — Encounter (HOSPITAL_BASED_OUTPATIENT_CLINIC_OR_DEPARTMENT_OTHER): Payer: Self-pay | Admitting: Emergency Medicine

## 2017-05-31 DIAGNOSIS — I442 Atrioventricular block, complete: Secondary | ICD-10-CM | POA: Insufficient documentation

## 2017-05-31 DIAGNOSIS — Z87891 Personal history of nicotine dependence: Secondary | ICD-10-CM | POA: Insufficient documentation

## 2017-05-31 DIAGNOSIS — Z7982 Long term (current) use of aspirin: Secondary | ICD-10-CM | POA: Insufficient documentation

## 2017-05-31 DIAGNOSIS — E119 Type 2 diabetes mellitus without complications: Secondary | ICD-10-CM | POA: Diagnosis not present

## 2017-05-31 DIAGNOSIS — R001 Bradycardia, unspecified: Secondary | ICD-10-CM | POA: Diagnosis present

## 2017-05-31 DIAGNOSIS — I129 Hypertensive chronic kidney disease with stage 1 through stage 4 chronic kidney disease, or unspecified chronic kidney disease: Secondary | ICD-10-CM | POA: Diagnosis not present

## 2017-05-31 DIAGNOSIS — Z79899 Other long term (current) drug therapy: Secondary | ICD-10-CM | POA: Diagnosis not present

## 2017-05-31 DIAGNOSIS — N189 Chronic kidney disease, unspecified: Secondary | ICD-10-CM | POA: Diagnosis not present

## 2017-05-31 LAB — COMPREHENSIVE METABOLIC PANEL
ALT: 16 U/L — ABNORMAL LOW (ref 17–63)
AST: 20 U/L (ref 15–41)
Albumin: 3.7 g/dL (ref 3.5–5.0)
Alkaline Phosphatase: 62 U/L (ref 38–126)
Anion gap: 10 (ref 5–15)
BUN: 33 mg/dL — AB (ref 6–20)
CHLORIDE: 103 mmol/L (ref 101–111)
CO2: 22 mmol/L (ref 22–32)
Calcium: 9.4 mg/dL (ref 8.9–10.3)
Creatinine, Ser: 1.68 mg/dL — ABNORMAL HIGH (ref 0.61–1.24)
GFR calc Af Amer: 41 mL/min — ABNORMAL LOW (ref >60)
GFR, EST NON AFRICAN AMERICAN: 35 mL/min — AB (ref >60)
Glucose, Bld: 170 mg/dL — ABNORMAL HIGH (ref 65–99)
POTASSIUM: 3.5 mmol/L (ref 3.5–5.1)
Sodium: 135 mmol/L (ref 135–145)
Total Bilirubin: 0.6 mg/dL (ref 0.3–1.2)
Total Protein: 6.4 g/dL — ABNORMAL LOW (ref 6.5–8.1)

## 2017-05-31 LAB — CBC WITH DIFFERENTIAL/PLATELET
BASOS ABS: 0 10*3/uL (ref 0.0–0.1)
BASOS PCT: 0 %
EOS ABS: 0.1 10*3/uL (ref 0.0–0.7)
EOS PCT: 1 %
HCT: 36.7 % — ABNORMAL LOW (ref 39.0–52.0)
Hemoglobin: 12.9 g/dL — ABNORMAL LOW (ref 13.0–17.0)
LYMPHS PCT: 21 %
Lymphs Abs: 1.4 10*3/uL (ref 0.7–4.0)
MCH: 32.9 pg (ref 26.0–34.0)
MCHC: 35.1 g/dL (ref 30.0–36.0)
MCV: 93.6 fL (ref 78.0–100.0)
MONO ABS: 0.6 10*3/uL (ref 0.1–1.0)
Monocytes Relative: 9 %
Neutro Abs: 4.7 10*3/uL (ref 1.7–7.7)
Neutrophils Relative %: 69 %
PLATELETS: 166 10*3/uL (ref 150–400)
RBC: 3.92 MIL/uL — ABNORMAL LOW (ref 4.22–5.81)
RDW: 12.6 % (ref 11.5–15.5)
WBC: 6.9 10*3/uL (ref 4.0–10.5)

## 2017-05-31 LAB — TROPONIN I: TROPONIN I: 0.04 ng/mL — AB (ref <0.03)

## 2017-05-31 MED ORDER — LOSARTAN POTASSIUM-HCTZ 100-25 MG PO TABS: 1.0000 | ORAL_TABLET | Freq: Every day | ORAL | Status: DC

## 2017-05-31 MED ORDER — METFORMIN HCL 500 MG PO TABS: 500.0000 mg | ORAL_TABLET | Freq: Two times a day (BID) | ORAL | Status: DC

## 2017-05-31 MED ORDER — ENOXAPARIN SODIUM 40 MG/0.4ML ~~LOC~~ SOLN
40.0000 mg | SUBCUTANEOUS | Status: DC
  Filled 2017-05-31: qty 0.4

## 2017-05-31 MED ORDER — ASPIRIN 81 MG PO TABS: 81.0000 mg | ORAL_TABLET | Freq: Every day | ORAL | Status: DC

## 2017-05-31 NOTE — ED Notes (Signed)
ED Provider at bedside. 

## 2017-05-31 NOTE — ED Notes (Signed)
This EMT offered meal and something to drink to patient . Patient declined meal and something to drink. He said he will ask once its dinner time. EMT told patient to use call light to call when he would like something to eat or drink. EMT offered wife a meal as well but she declined as well. Patient and family member are aware to let someone know if they want some food or drink.

## 2017-05-31 NOTE — ED Triage Notes (Addendum)
Pt reports heart rate reading in the 20-30's today. C/o mild dizziness, denies chest pain or SOB. Pt seen by PCP on 3/21 and HR noted to be in the 20's then. Pt was sent home and instructed to stop taking the carvedilol. Has not taken it since then.

## 2017-05-31 NOTE — ED Provider Notes (Addendum)
[facility] EMERGENCY DEPARTMENT Provider Note   CSN: [id number] Arrival date & time: 05/31/17  1204     History   Chief Complaint Chief Complaint  Patient presents with  . Bradycardia    HPI LOI Ray Walters is a 84 y.o. male.  HPI Patient has had low heart rate.  He was seen by his PCP day before yesterday and heart rate was in the 20s.  Carvedilol was discontinued.  Patient continues to have heart rate in the 30s.  He has not had a syncopal episode.  Patient reports he can get up and move around but does prefer to stay at rest.  No recent fever or chills.  No vomiting or diarrhea.  No appetite loss. Past Medical History:  Diagnosis Date  . 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 Chronic renal insufficiency   . Diabetes mellitus   . Heart murmur   . Hypertension   . Loss of one eye    right eye blasting accident    Patient Active Problem List   Diagnosis Date Noted  . Bradycardia 05/31/2017  . Hyperlipidemia 07/24/2011  . Heart murmur 05/22/2011  . HTN (hypertension) 05/22/2011  . Diabetes mellitus 05/22/2011  . Renal insufficiency 05/22/2011  . Amputation, left hand, traumatic 05/22/2011  . Loss of right eye 05/22/2011    Past Surgical History:  Procedure Laterality Date  . APPENDECTOMY    . loss left hand    . loss of right eye    . TONSILECTOMY, ADENOIDECTOMY, BILATERAL MYRINGOTOMY AND TUBES          Home Medications    Prior to Admission medications   Medication Sig Start Date End Date Taking? Authorizing Provider  aspirin 81 MG tablet Take 81 mg by mouth daily.   Yes [provider]  CALCIUM PO Take 1,000 mg by mouth daily.   Yes [provider]  losartan-hydrochlorothiazide (HYZAAR) 100-25 MG per tablet Take 1 tablet by mouth daily. 05/25/13  Yes Nahser, Wonda Cheng, MD  metFORMIN (GLUCOPHAGE) 500 MG tablet Take 500 mg by mouth 2 (two) times daily with a meal.   Yes [provider]  NON FORMULARY Mangosteem Daily   Yes [provider]  simvastatin (ZOCOR) 40 MG tablet TAKE ONE TABLET AT BEDTIME 05/07/13  Yes Nahser, Wonda Cheng, MD  carvedilol (COREG) 12.5 MG tablet Take 1 tablet (12.5 mg total) by mouth 2 (two) times daily with a meal. 09/09/12   Nahser, Wonda Cheng, MD  rosuvastatin (CRESTOR) 10 MG tablet Take 10 mg by mouth daily.    [provider]    Family History Family History  Problem Relation Age of Onset  . Parkinsonism Father     Social History Social History   Tobacco Use  . Smoking status: Former Smoker    Packs/day: 1.00    Years: 20.00    Pack years: 20.00    Last attempt to quit: 03/23/1966    Years since quitting: 51.2  . Smokeless tobacco: Never Used  Substance Use Topics  . Alcohol use: No  . Drug use: No     Allergies   Carvedilol   Review of Systems Review of Systems 10 Systems reviewed and are negative for acute change except as noted in the HPI.   Physical Exam Updated Vital Signs BP (!) 147/51 (BP Location: Left Arm)   Pulse (!) 38   Temp 97.8 F (36.6 C) (Oral)   Resp 14  Ht 6' (1.829 m)   Wt 87.1 kg (192 lb)   SpO2 94%   BMI 26.04 kg/m   Physical Exam  Constitutional: He is oriented to person, place, and time. He appears well-developed and well-nourished. No distress.  HENT:  Head: Normocephalic and atraumatic.  Mouth/Throat: Oropharynx is clear and moist.  Eyes: Conjunctivae are normal.  Right eye gone.  Lid stays closed.  Neck: Neck supple.  Cardiovascular:  No murmur heard. Bradycardia.  Correlates to 30s on the monitor.  Pulmonary/Chest: Effort normal and breath sounds normal. No respiratory distress.  Abdominal: Soft. He exhibits no distension. There is no tenderness. There is no guarding.  Musculoskeletal: Normal range of motion. He exhibits no edema or tenderness.  Neurological: He is alert and oriented to person, place, and time. No cranial nerve deficit. He exhibits normal muscle  tone. Coordination normal.  Skin: Skin is warm and dry.  Psychiatric: He has a normal mood and affect.  Nursing note and vitals reviewed.    ED Treatments / Results  Labs (all labs ordered are listed, but only abnormal results are displayed) Labs Reviewed  CBC WITH DIFFERENTIAL/PLATELET - Abnormal; Notable for the following components:      Result Value   RBC 3.92 (*)    Hemoglobin 12.9 (*)    HCT 36.7 (*)    All other components within normal limits  COMPREHENSIVE METABOLIC PANEL - Abnormal; Notable for the following components:   Glucose, Bld 170 (*)    BUN 33 (*)    Creatinine, Ser 1.68 (*)    Total Protein 6.4 (*)    ALT 16 (*)    GFR calc non Af Amer 35 (*)    GFR calc Af Amer 41 (*)    All other components within normal limits  TROPONIN I - Abnormal; Notable for the following components:   Troponin I 0.04 (*)    All other components within normal limits  CBC     EKG Interpretation  Date/Time:  Saturday May 31 2017 12:17:42 EDT Ventricular Rate:  32 PR Interval:    QRS Duration: 148 QT Interval:  540 QTC Calculation: 394 R Axis:   106 Text Interpretation: Critical Test Result: Arrhythmia , AV Block Marked sinus bradycardia with A-V dissociation and Idioventricular rhythm Rightward axis Non-specific intra-ventricular conduction block Abnormal ECG agree, no sig QRS change from previous Confirmed by Charlesetta Shanks [phone]) on 05/31/2017 12:57:23 PM Radiology Dg Chest Portable 1 View  Result Date: 05/31/2017 CLINICAL DATA:  84 year old with bradycardia. Heart rate in the 30s. EXAM: PORTABLE CHEST 1 VIEW COMPARISON:  05/30/2015 FINDINGS: Heart size is within normal limits. Slightly prominent central vascular structures are stable. No pulmonary edema. No focal airspace disease. Negative for a pneumothorax. IMPRESSION: No acute cardiopulmonary disease. Electronically Signed   By: Markus Daft M.D.   On: 05/31/2017 12:39    Procedures Procedures (including critical care  time)  Medications Ordered in ED Medications  aspirin tablet 81 mg (has no administration in time range)  losartan-hydrochlorothiazide (HYZAAR) 100-25 MG per tablet 1 tablet (has no administration in time range)  metFORMIN (GLUCOPHAGE) tablet 500 mg (has no administration in time range)  enoxaparin (LOVENOX) injection 40 mg (has no administration in time range)     Initial Impression / Assessment and Plan / ED Course  I have reviewed the triage vital signs and the nursing notes.  Pertinent labs & imaging results that were available during my care of the patient were reviewed by me and considered  in my medical decision making (see chart for details).     Consult: Cardiology Dr.Coitoru.  Accepts patient for transfer to [facility] for monitoring and evaluation for pacemaker.  At this time, there are no monitor beds available.  He advises to continue patient's regular medications except for carvedilol, DVT prophylaxis, transfer patient when bed becomes available.  If patient becomes symptomatic with bradycardia may try atropine.  If patient remains asymptomatic no specific intervention at this time.  Final Clinical Impressions(s) / ED Diagnoses   Final diagnoses:  Heart block AV third degree [facility])  Patient presents with bradycardia.  This was diagnosed day before yesterday.  Carvedilol has been discontinued for approximately 36 hours.  Patient remains very bradycardic in the 30s.  He is however having adequate perfusion with stable blood pressure, stable mentation and no respiratory distress.  At this time we will continue monitoring as we await bed availability on cardiology service at [facility].  15:15 patient reports he will not stay in the hospital.  He wants to go home.  He believes that his primary care doctor is correct in that once the carvedilol wears off his heart rate will go back to normal.  Reports is better than it was yesterday and he does not want to stay in the  hospital.  Patient is made aware that he more than likely has heart block\chronic bradycardia that will need a pacemaker.  He is made aware that he may have sudden death or severe disability if treatment is delayed.  Patient reports he is aware of that and he does not wish further treatment at this time.  Patient is alert appropriate.  Cognitive function is intact.  No indication of confusion or impaired judgment other than some lack of insight into poor outcome if treatment is delayed.  Patient does not make a commitment to his CODE STATUS.  Reports he would not accept a pacemaker and yet he does not fully endorse DNR status.  Patient's wife is in strongly in favor the patient staying in pursuing treatment.  However he is not agreeable to her wishes.  ED Discharge Orders    None       Charlesetta Shanks, MD 05/31/17 1408    Charlesetta Shanks, MD 05/31/17 [phone]

## 2017-05-31 NOTE — ED Notes (Signed)
Pt requesting to talk to MD about admission vs discharge. MD made aware at this time.

## 2017-06-04 ENCOUNTER — Other Ambulatory Visit: Payer: Self-pay | Admitting: Internal Medicine

## 2017-06-04 DIAGNOSIS — H539 Unspecified visual disturbance: Secondary | ICD-10-CM

## 2017-06-04 DIAGNOSIS — I495 Sick sinus syndrome: Secondary | ICD-10-CM | POA: Diagnosis not present

## 2017-06-04 DIAGNOSIS — R252 Cramp and spasm: Secondary | ICD-10-CM | POA: Diagnosis not present

## 2017-06-04 DIAGNOSIS — I10 Essential (primary) hypertension: Secondary | ICD-10-CM | POA: Diagnosis not present

## 2017-06-06 ENCOUNTER — Ambulatory Visit
Admission: RE | Admit: 2017-06-06 | Discharge: 2017-06-06 | Disposition: A | Discharge status: Home / Self Care | Payer: Medicare HMO | Source: Ambulatory Visit | Attending: Internal Medicine | Admitting: Internal Medicine

## 2017-06-06 DIAGNOSIS — H539 Unspecified visual disturbance: Secondary | ICD-10-CM

## 2017-06-06 DIAGNOSIS — I6523 Occlusion and stenosis of bilateral carotid arteries: Secondary | ICD-10-CM | POA: Diagnosis not present

## 2017-06-08 DIAGNOSIS — E612 Magnesium deficiency: Secondary | ICD-10-CM | POA: Diagnosis not present

## 2017-06-08 DIAGNOSIS — N189 Chronic kidney disease, unspecified: Secondary | ICD-10-CM | POA: Diagnosis not present

## 2017-06-08 DIAGNOSIS — I2510 Atherosclerotic heart disease of native coronary artery without angina pectoris: Secondary | ICD-10-CM | POA: Diagnosis not present

## 2017-06-08 DIAGNOSIS — E7849 Other hyperlipidemia: Secondary | ICD-10-CM | POA: Diagnosis not present

## 2017-06-08 DIAGNOSIS — I442 Atrioventricular block, complete: Secondary | ICD-10-CM | POA: Diagnosis not present

## 2017-06-08 DIAGNOSIS — R0602 Shortness of breath: Secondary | ICD-10-CM | POA: Diagnosis not present

## 2017-06-08 DIAGNOSIS — E119 Type 2 diabetes mellitus without complications: Secondary | ICD-10-CM | POA: Diagnosis not present

## 2017-06-08 DIAGNOSIS — Z955 Presence of coronary angioplasty implant and graft: Secondary | ICD-10-CM | POA: Diagnosis not present

## 2017-06-08 DIAGNOSIS — I509 Heart failure, unspecified: Secondary | ICD-10-CM | POA: Diagnosis not present

## 2017-06-08 DIAGNOSIS — E876 Hypokalemia: Secondary | ICD-10-CM | POA: Diagnosis not present

## 2017-06-08 DIAGNOSIS — R7989 Other specified abnormal findings of blood chemistry: Secondary | ICD-10-CM | POA: Diagnosis not present

## 2017-06-08 DIAGNOSIS — J90 Pleural effusion, not elsewhere classified: Secondary | ICD-10-CM | POA: Diagnosis not present

## 2017-06-08 DIAGNOSIS — E1165 Type 2 diabetes mellitus with hyperglycemia: Secondary | ICD-10-CM | POA: Diagnosis not present

## 2017-06-08 DIAGNOSIS — I1310 Hypertensive heart and chronic kidney disease without heart failure, with stage 1 through stage 4 chronic kidney disease, or unspecified chronic kidney disease: Secondary | ICD-10-CM | POA: Diagnosis not present

## 2017-06-08 DIAGNOSIS — Z7982 Long term (current) use of aspirin: Secondary | ICD-10-CM | POA: Diagnosis not present

## 2017-06-08 DIAGNOSIS — R69 Illness, unspecified: Secondary | ICD-10-CM | POA: Diagnosis not present

## 2017-06-08 DIAGNOSIS — E785 Hyperlipidemia, unspecified: Secondary | ICD-10-CM | POA: Diagnosis not present

## 2017-06-08 DIAGNOSIS — R001 Bradycardia, unspecified: Secondary | ICD-10-CM | POA: Diagnosis not present

## 2017-06-08 DIAGNOSIS — K5900 Constipation, unspecified: Secondary | ICD-10-CM | POA: Diagnosis not present

## 2017-06-08 DIAGNOSIS — R42 Dizziness and giddiness: Secondary | ICD-10-CM | POA: Diagnosis not present

## 2017-06-08 DIAGNOSIS — N183 Chronic kidney disease, stage 3 (moderate): Secondary | ICD-10-CM | POA: Diagnosis not present

## 2017-06-08 DIAGNOSIS — Z950 Presence of cardiac pacemaker: Secondary | ICD-10-CM | POA: Diagnosis not present

## 2017-06-08 DIAGNOSIS — I517 Cardiomegaly: Secondary | ICD-10-CM | POA: Diagnosis not present

## 2017-06-08 DIAGNOSIS — E1122 Type 2 diabetes mellitus with diabetic chronic kidney disease: Secondary | ICD-10-CM | POA: Diagnosis not present

## 2017-06-08 DIAGNOSIS — R5383 Other fatigue: Secondary | ICD-10-CM | POA: Diagnosis not present

## 2017-06-08 DIAGNOSIS — I10 Essential (primary) hypertension: Secondary | ICD-10-CM | POA: Diagnosis not present

## 2017-06-08 DIAGNOSIS — I454 Nonspecific intraventricular block: Secondary | ICD-10-CM | POA: Diagnosis not present

## 2017-06-08 DIAGNOSIS — D72829 Elevated white blood cell count, unspecified: Secondary | ICD-10-CM | POA: Diagnosis not present

## 2017-06-08 DIAGNOSIS — I459 Conduction disorder, unspecified: Secondary | ICD-10-CM | POA: Diagnosis not present

## 2017-06-08 DIAGNOSIS — R0600 Dyspnea, unspecified: Secondary | ICD-10-CM | POA: Diagnosis not present

## 2017-06-08 DIAGNOSIS — I129 Hypertensive chronic kidney disease with stage 1 through stage 4 chronic kidney disease, or unspecified chronic kidney disease: Secondary | ICD-10-CM | POA: Diagnosis not present

## 2017-06-08 DIAGNOSIS — Z9861 Coronary angioplasty status: Secondary | ICD-10-CM | POA: Diagnosis not present

## 2017-06-08 DIAGNOSIS — N179 Acute kidney failure, unspecified: Secondary | ICD-10-CM | POA: Diagnosis not present

## 2017-06-15 DIAGNOSIS — R109 Unspecified abdominal pain: Secondary | ICD-10-CM | POA: Diagnosis not present

## 2017-06-15 DIAGNOSIS — N183 Chronic kidney disease, stage 3 (moderate): Secondary | ICD-10-CM | POA: Diagnosis not present

## 2017-06-15 DIAGNOSIS — I10 Essential (primary) hypertension: Secondary | ICD-10-CM | POA: Diagnosis not present

## 2017-06-15 DIAGNOSIS — R079 Chest pain, unspecified: Secondary | ICD-10-CM | POA: Diagnosis not present

## 2017-06-15 DIAGNOSIS — R2240 Localized swelling, mass and lump, unspecified lower limb: Secondary | ICD-10-CM | POA: Diagnosis not present

## 2017-06-15 DIAGNOSIS — E785 Hyperlipidemia, unspecified: Secondary | ICD-10-CM | POA: Diagnosis not present

## 2017-06-15 DIAGNOSIS — Z45018 Encounter for adjustment and management of other part of cardiac pacemaker: Secondary | ICD-10-CM | POA: Diagnosis not present

## 2017-06-15 DIAGNOSIS — R2689 Other abnormalities of gait and mobility: Secondary | ICD-10-CM | POA: Diagnosis not present

## 2017-06-15 DIAGNOSIS — E1165 Type 2 diabetes mellitus with hyperglycemia: Secondary | ICD-10-CM | POA: Diagnosis not present

## 2017-06-15 DIAGNOSIS — Z950 Presence of cardiac pacemaker: Secondary | ICD-10-CM | POA: Diagnosis not present

## 2017-06-15 DIAGNOSIS — E876 Hypokalemia: Secondary | ICD-10-CM | POA: Diagnosis not present

## 2017-06-15 DIAGNOSIS — D649 Anemia, unspecified: Secondary | ICD-10-CM | POA: Diagnosis not present

## 2017-06-15 DIAGNOSIS — I82401 Acute embolism and thrombosis of unspecified deep veins of right lower extremity: Secondary | ICD-10-CM | POA: Diagnosis not present

## 2017-06-15 DIAGNOSIS — R072 Precordial pain: Secondary | ICD-10-CM | POA: Diagnosis not present

## 2017-06-15 DIAGNOSIS — Z7902 Long term (current) use of antithrombotics/antiplatelets: Secondary | ICD-10-CM | POA: Diagnosis not present

## 2017-06-15 DIAGNOSIS — E871 Hypo-osmolality and hyponatremia: Secondary | ICD-10-CM | POA: Diagnosis not present

## 2017-06-15 DIAGNOSIS — E889 Metabolic disorder, unspecified: Secondary | ICD-10-CM | POA: Diagnosis not present

## 2017-06-15 DIAGNOSIS — R1084 Generalized abdominal pain: Secondary | ICD-10-CM | POA: Diagnosis not present

## 2017-06-15 DIAGNOSIS — E1122 Type 2 diabetes mellitus with diabetic chronic kidney disease: Secondary | ICD-10-CM | POA: Diagnosis not present

## 2017-06-15 DIAGNOSIS — I129 Hypertensive chronic kidney disease with stage 1 through stage 4 chronic kidney disease, or unspecified chronic kidney disease: Secondary | ICD-10-CM | POA: Diagnosis not present

## 2017-06-16 DIAGNOSIS — D649 Anemia, unspecified: Secondary | ICD-10-CM | POA: Diagnosis not present

## 2017-06-16 DIAGNOSIS — E1165 Type 2 diabetes mellitus with hyperglycemia: Secondary | ICD-10-CM | POA: Diagnosis not present

## 2017-06-16 DIAGNOSIS — R69 Illness, unspecified: Secondary | ICD-10-CM | POA: Diagnosis not present

## 2017-06-16 DIAGNOSIS — I491 Atrial premature depolarization: Secondary | ICD-10-CM | POA: Diagnosis not present

## 2017-06-16 DIAGNOSIS — E785 Hyperlipidemia, unspecified: Secondary | ICD-10-CM | POA: Diagnosis not present

## 2017-06-16 DIAGNOSIS — I129 Hypertensive chronic kidney disease with stage 1 through stage 4 chronic kidney disease, or unspecified chronic kidney disease: Secondary | ICD-10-CM | POA: Diagnosis not present

## 2017-06-16 DIAGNOSIS — I2510 Atherosclerotic heart disease of native coronary artery without angina pectoris: Secondary | ICD-10-CM | POA: Diagnosis not present

## 2017-06-16 DIAGNOSIS — I1310 Hypertensive heart and chronic kidney disease without heart failure, with stage 1 through stage 4 chronic kidney disease, or unspecified chronic kidney disease: Secondary | ICD-10-CM | POA: Diagnosis not present

## 2017-06-16 DIAGNOSIS — Z950 Presence of cardiac pacemaker: Secondary | ICD-10-CM | POA: Diagnosis not present

## 2017-06-16 DIAGNOSIS — Z955 Presence of coronary angioplasty implant and graft: Secondary | ICD-10-CM | POA: Diagnosis not present

## 2017-06-16 DIAGNOSIS — E871 Hypo-osmolality and hyponatremia: Secondary | ICD-10-CM | POA: Diagnosis not present

## 2017-06-16 DIAGNOSIS — Z7902 Long term (current) use of antithrombotics/antiplatelets: Secondary | ICD-10-CM | POA: Diagnosis not present

## 2017-06-16 DIAGNOSIS — E876 Hypokalemia: Secondary | ICD-10-CM | POA: Diagnosis not present

## 2017-06-16 DIAGNOSIS — N183 Chronic kidney disease, stage 3 (moderate): Secondary | ICD-10-CM | POA: Diagnosis not present

## 2017-06-16 DIAGNOSIS — E7849 Other hyperlipidemia: Secondary | ICD-10-CM | POA: Diagnosis not present

## 2017-06-16 DIAGNOSIS — R109 Unspecified abdominal pain: Secondary | ICD-10-CM | POA: Diagnosis not present

## 2017-06-17 DIAGNOSIS — I129 Hypertensive chronic kidney disease with stage 1 through stage 4 chronic kidney disease, or unspecified chronic kidney disease: Secondary | ICD-10-CM | POA: Diagnosis not present

## 2017-06-17 DIAGNOSIS — E1122 Type 2 diabetes mellitus with diabetic chronic kidney disease: Secondary | ICD-10-CM | POA: Diagnosis not present

## 2017-06-17 DIAGNOSIS — E876 Hypokalemia: Secondary | ICD-10-CM | POA: Diagnosis not present

## 2017-06-17 DIAGNOSIS — R5381 Other malaise: Secondary | ICD-10-CM | POA: Diagnosis not present

## 2017-06-17 DIAGNOSIS — Z5329 Procedure and treatment not carried out because of patient's decision for other reasons: Secondary | ICD-10-CM | POA: Diagnosis not present

## 2017-06-17 DIAGNOSIS — E871 Hypo-osmolality and hyponatremia: Secondary | ICD-10-CM | POA: Diagnosis not present

## 2017-06-17 DIAGNOSIS — E1165 Type 2 diabetes mellitus with hyperglycemia: Secondary | ICD-10-CM | POA: Diagnosis not present

## 2017-06-17 DIAGNOSIS — N183 Chronic kidney disease, stage 3 (moderate): Secondary | ICD-10-CM | POA: Diagnosis not present

## 2017-06-17 DIAGNOSIS — E785 Hyperlipidemia, unspecified: Secondary | ICD-10-CM | POA: Diagnosis not present

## 2017-06-20 DIAGNOSIS — R001 Bradycardia, unspecified: Secondary | ICD-10-CM | POA: Diagnosis not present

## 2017-06-20 DIAGNOSIS — R600 Localized edema: Secondary | ICD-10-CM | POA: Diagnosis not present

## 2017-06-20 DIAGNOSIS — I498 Other specified cardiac arrhythmias: Secondary | ICD-10-CM | POA: Diagnosis not present

## 2017-06-20 DIAGNOSIS — R5381 Other malaise: Secondary | ICD-10-CM | POA: Diagnosis not present

## 2017-06-20 DIAGNOSIS — I442 Atrioventricular block, complete: Secondary | ICD-10-CM | POA: Diagnosis not present

## 2017-06-24 DIAGNOSIS — I442 Atrioventricular block, complete: Secondary | ICD-10-CM | POA: Diagnosis not present

## 2017-06-24 DIAGNOSIS — E871 Hypo-osmolality and hyponatremia: Secondary | ICD-10-CM | POA: Diagnosis not present

## 2017-06-24 DIAGNOSIS — I517 Cardiomegaly: Secondary | ICD-10-CM | POA: Diagnosis not present

## 2017-06-24 DIAGNOSIS — R531 Weakness: Secondary | ICD-10-CM | POA: Diagnosis not present

## 2017-06-24 DIAGNOSIS — R609 Edema, unspecified: Secondary | ICD-10-CM | POA: Diagnosis not present

## 2017-06-24 DIAGNOSIS — E876 Hypokalemia: Secondary | ICD-10-CM | POA: Diagnosis not present

## 2017-06-24 DIAGNOSIS — I080 Rheumatic disorders of both mitral and aortic valves: Secondary | ICD-10-CM | POA: Diagnosis not present

## 2017-06-25 DIAGNOSIS — I495 Sick sinus syndrome: Secondary | ICD-10-CM | POA: Diagnosis not present

## 2017-06-25 DIAGNOSIS — Z950 Presence of cardiac pacemaker: Secondary | ICD-10-CM | POA: Diagnosis not present

## 2017-06-25 DIAGNOSIS — Z955 Presence of coronary angioplasty implant and graft: Secondary | ICD-10-CM | POA: Diagnosis not present

## 2017-06-25 DIAGNOSIS — I2510 Atherosclerotic heart disease of native coronary artery without angina pectoris: Secondary | ICD-10-CM | POA: Diagnosis not present

## 2017-06-25 DIAGNOSIS — J90 Pleural effusion, not elsewhere classified: Secondary | ICD-10-CM | POA: Diagnosis not present

## 2017-06-26 ENCOUNTER — Ambulatory Visit: Payer: Medicare HMO | Admitting: Cardiology

## 2017-06-26 ENCOUNTER — Encounter: Payer: Self-pay | Admitting: Cardiology

## 2017-06-26 VITALS — BP 104/62 | HR 73 | Ht 72.0 in | Wt 176.0 lb

## 2017-06-26 DIAGNOSIS — E1122 Type 2 diabetes mellitus with diabetic chronic kidney disease: Secondary | ICD-10-CM

## 2017-06-26 DIAGNOSIS — Z950 Presence of cardiac pacemaker: Secondary | ICD-10-CM

## 2017-06-26 DIAGNOSIS — N183 Chronic kidney disease, stage 3 unspecified: Secondary | ICD-10-CM

## 2017-06-26 DIAGNOSIS — I10 Essential (primary) hypertension: Secondary | ICD-10-CM | POA: Diagnosis not present

## 2017-06-26 DIAGNOSIS — Z9861 Coronary angioplasty status: Secondary | ICD-10-CM

## 2017-06-26 DIAGNOSIS — I2510 Atherosclerotic heart disease of native coronary artery without angina pectoris: Secondary | ICD-10-CM | POA: Insufficient documentation

## 2017-06-26 DIAGNOSIS — N289 Disorder of kidney and ureter, unspecified: Secondary | ICD-10-CM | POA: Diagnosis not present

## 2017-06-26 DIAGNOSIS — R001 Bradycardia, unspecified: Secondary | ICD-10-CM

## 2017-06-26 NOTE — Progress Notes (Signed)
06/26/2017 Ray Walters   1927/04/19  [id number]  Primary Physician Leeroy Cha, MD Primary Cardiologist: Dr Rohrbeck/ Dr Carmie End  HPI:  84 y/o male seen by Dr Acie Fredrickson in 2014. The pt presented to the ED at [facility] 05/31/17, sent from his PCPs office with bradycardia. He had been on Coreg. Admission was advised but the pt signed out AMA after 24 hrs in ED holding, (no beds at [facility]). His PCP set him up for an office visit here with Dr Sallyanne Kuster for possible pacemaker evaluation. He is here today for that appointment.  Apparently the pt went to [facility] on 06/09/17 and was admitted there with weakness and bradycardia. He had a cath and subsequent LADS DES and Dx2 POBA. 48 hrs later he had a pacemaker placed for persistent bradycardia. I don't have all those records available to me. He saw Brn Stag PA in follow up 06/24/17, again not all those records are available. The pat and his wife decided to keep the appointment here today, scheduled after his ED visit 05/31/17.   Since his PCI and PTVDP he has done well. His wife says he feels "cold". He has some generalized weakness, no syncope, tachycardia, or chest pain. He was discharged on Eliquis, I'm not sure why.    Current Outpatient Medications  Medication Sig Dispense Refill  . aspirin 81 MG tablet Take 81 mg by mouth daily.    Marland Kitchen CALCIUM PO Take 1,000 mg by mouth daily.    . carvedilol (COREG) 12.5 MG tablet Take 1 tablet (12.5 mg total) by mouth 2 (two) times daily with a meal. 60 tablet 6  . clopidogrel (PLAVIX) 75 MG tablet Take 75 mg by mouth daily.     Marland Kitchen Co-Enzyme Q-10 30 MG CAPS Take by mouth daily.    Marland Kitchen ELIQUIS 5 MG TABS tablet 2 (two) times daily.    . furosemide (LASIX) 20 MG tablet 20 mg daily.     Marland Kitchen losartan-hydrochlorothiazide (HYZAAR) 100-25 MG per tablet Take 1 tablet by mouth daily. 90 tablet 1  . metFORMIN (GLUCOPHAGE) 500 MG tablet Take 500 mg by mouth 2 (two) times daily with a meal.    . NON  FORMULARY Mangosteem Daily    . rosuvastatin (CRESTOR) 10 MG tablet Take 10 mg by mouth daily.     No current facility-administered medications for this visit.     Allergies  Allergen Reactions  . Carvedilol     Past Medical History:  Diagnosis Date  . 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 Chronic renal insufficiency   . Diabetes mellitus   . Heart murmur   . Hypertension   . Loss of one eye    right eye blasting accident    Social History   Socioeconomic History  . Marital status: Married    Spouse name: Not on file  . Number of children: Not on file  . Years of education: Not on file  . Highest education level: Not on file  Occupational History  . Occupation: retired Optometrist  Social Needs  . Financial resource strain: Not on file  . Food insecurity:    Worry: Not on file    Inability: Not on file  . Transportation needs:    Medical: Not on file    Non-medical: Not on file  Tobacco Use  . Smoking status: Former Smoker    Packs/day: 1.00    Years: 20.00  Pack years: 20.00    Last attempt to quit: 03/23/1966    Years since quitting: 51.2  . Smokeless tobacco: Never Used  Substance and Sexual Activity  . Alcohol use: No  . Drug use: No  . Sexual activity: Not on file  Lifestyle  . Physical activity:    Days per week: Not on file    Minutes per session: Not on file  . Stress: Not on file  Relationships  . Social connections:    Talks on phone: Not on file    Gets together: Not on file    Attends religious service: Not on file    Active member of club or organization: Not on file    Attends meetings of clubs or organizations: Not on file    Relationship status: Not on file  . Intimate partner violence:    Fear of current or ex partner: Not on file    Emotionally abused: Not on file    Physically abused: Not on file    Forced sexual activity: Not on file  Other Topics Concern  . Not on file  Social  History Narrative  . Not on file     Family History  Problem Relation Age of Onset  . Parkinsonism Father      Review of Systems: General: negative for chills, fever, night sweats or weight changes.  Cardiovascular: negative for chest pain, dyspnea on exertion, edema, orthopnea, palpitations, paroxysmal nocturnal dyspnea or shortness of breath Dermatological: negative for rash Respiratory: negative for cough or wheezing Urologic: negative for hematuria Abdominal: negative for nausea, vomiting, diarrhea, bright red blood per rectum, melena, or hematemesis Neurologic: negative for visual changes, syncope, or dizziness All other systems reviewed and are otherwise negative except as noted above.    Blood pressure 104/62, pulse 73, height 6' (1.829 m), weight 176 lb (79.8 kg), SpO2 96 %.  General appearance: alert, cooperative, appears stated age and no distress Neck: no carotid bruit and no JVD Lungs: clear to auscultation bilaterally and pacer site without hematoma, ecchymosis, or signs of infection Heart: regular rate and rhythm Abdomen: soft, non-tender; bowel sounds normal; no masses,  no organomegaly Extremities: LUE ampution secondary to remote trauma Pulses: 2+ and symmetric Skin: pale, cool, dry Neurologic: Grossly normal  EKG Paced  ASSESSMENT AND PLAN:   Symptomatic bradycardia Pacemaker implanted 06/11/17 in [facility]- Dr Minna Merritts  CAD S/P percutaneous coronary angioplasty LAD PCI with DES and Dx2 POBA- 06/09/17- Dr Marijo File in [facility]  Renal insufficiency He appears to have stage 3 CRI  HTN (hypertension) Controlled  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 I discuss his case with Dr Sallyanne Kuster and we both feel the pt is best served by following up with his cardiologist in [facility]. We do  not need to see him in the future unless his circumstances change.  Kerin Ransom PA-C 06/26/2017 2:28 PM

## 2017-06-26 NOTE — Patient Instructions (Signed)
Medication Instructions:  STOP HCTZ  Labwork: None   Testing/Procedures: None   Follow-Up: Keep upcoming appoint,ent with Cardiologist in [facility] as scheduled; follow up with out office as needed  Any Other Special Instructions Will Be Listed Below (If Applicable).  If you need a refill on your cardiac medications before your next appointment, please call your pharmacy.

## 2017-06-26 NOTE — Assessment & Plan Note (Signed)
LAD PCI with DES and Dx2 POBA- 06/09/17- Dr Marijo File in [facility]

## 2017-06-26 NOTE — Assessment & Plan Note (Signed)
He appears to have stage 3 CRI

## 2017-06-26 NOTE — Assessment & Plan Note (Signed)
Pacemaker implanted 06/11/17 in [facility]- Dr Minna Merritts

## 2017-06-26 NOTE — Assessment & Plan Note (Signed)
Followed by PCP- on Metformin

## 2017-06-26 NOTE — Assessment & Plan Note (Signed)
Controlled.  

## 2017-06-27 DIAGNOSIS — R001 Bradycardia, unspecified: Secondary | ICD-10-CM | POA: Diagnosis not present

## 2017-06-27 DIAGNOSIS — R5381 Other malaise: Secondary | ICD-10-CM | POA: Diagnosis not present

## 2017-06-27 DIAGNOSIS — I10 Essential (primary) hypertension: Secondary | ICD-10-CM | POA: Diagnosis not present

## 2017-06-27 DIAGNOSIS — Z45018 Encounter for adjustment and management of other part of cardiac pacemaker: Secondary | ICD-10-CM | POA: Diagnosis not present

## 2017-06-27 DIAGNOSIS — I459 Conduction disorder, unspecified: Secondary | ICD-10-CM | POA: Diagnosis not present

## 2017-06-27 DIAGNOSIS — I498 Other specified cardiac arrhythmias: Secondary | ICD-10-CM | POA: Diagnosis not present

## 2017-07-08 DIAGNOSIS — R69 Illness, unspecified: Secondary | ICD-10-CM | POA: Diagnosis not present

## 2017-07-08 DIAGNOSIS — Z95820 Peripheral vascular angioplasty status with implants and grafts: Secondary | ICD-10-CM | POA: Diagnosis not present

## 2017-07-08 DIAGNOSIS — E876 Hypokalemia: Secondary | ICD-10-CM | POA: Diagnosis not present

## 2017-07-10 DIAGNOSIS — Z9861 Coronary angioplasty status: Secondary | ICD-10-CM | POA: Diagnosis not present

## 2017-07-10 DIAGNOSIS — Z95820 Peripheral vascular angioplasty status with implants and grafts: Secondary | ICD-10-CM | POA: Diagnosis not present

## 2017-07-14 DIAGNOSIS — Z95820 Peripheral vascular angioplasty status with implants and grafts: Secondary | ICD-10-CM | POA: Diagnosis not present

## 2017-07-14 DIAGNOSIS — Z9861 Coronary angioplasty status: Secondary | ICD-10-CM | POA: Diagnosis not present

## 2017-07-16 DIAGNOSIS — Z9861 Coronary angioplasty status: Secondary | ICD-10-CM | POA: Diagnosis not present

## 2017-07-16 DIAGNOSIS — Z95820 Peripheral vascular angioplasty status with implants and grafts: Secondary | ICD-10-CM | POA: Diagnosis not present

## 2017-07-17 DIAGNOSIS — Z95820 Peripheral vascular angioplasty status with implants and grafts: Secondary | ICD-10-CM | POA: Diagnosis not present

## 2017-07-17 DIAGNOSIS — Z9861 Coronary angioplasty status: Secondary | ICD-10-CM | POA: Diagnosis not present

## 2017-07-21 DIAGNOSIS — Z9861 Coronary angioplasty status: Secondary | ICD-10-CM | POA: Diagnosis not present

## 2017-07-21 DIAGNOSIS — Z95820 Peripheral vascular angioplasty status with implants and grafts: Secondary | ICD-10-CM | POA: Diagnosis not present

## 2017-07-22 DIAGNOSIS — R600 Localized edema: Secondary | ICD-10-CM | POA: Diagnosis not present

## 2017-07-22 DIAGNOSIS — N179 Acute kidney failure, unspecified: Secondary | ICD-10-CM | POA: Diagnosis not present

## 2017-07-22 DIAGNOSIS — R001 Bradycardia, unspecified: Secondary | ICD-10-CM | POA: Diagnosis not present

## 2017-07-22 DIAGNOSIS — I498 Other specified cardiac arrhythmias: Secondary | ICD-10-CM | POA: Diagnosis not present

## 2017-07-22 DIAGNOSIS — D649 Anemia, unspecified: Secondary | ICD-10-CM | POA: Diagnosis not present

## 2017-07-22 DIAGNOSIS — I129 Hypertensive chronic kidney disease with stage 1 through stage 4 chronic kidney disease, or unspecified chronic kidney disease: Secondary | ICD-10-CM | POA: Diagnosis not present

## 2017-07-22 DIAGNOSIS — E1165 Type 2 diabetes mellitus with hyperglycemia: Secondary | ICD-10-CM | POA: Diagnosis not present

## 2017-07-22 DIAGNOSIS — E871 Hypo-osmolality and hyponatremia: Secondary | ICD-10-CM | POA: Diagnosis not present

## 2017-07-22 DIAGNOSIS — R5381 Other malaise: Secondary | ICD-10-CM | POA: Diagnosis not present

## 2017-07-22 DIAGNOSIS — I459 Conduction disorder, unspecified: Secondary | ICD-10-CM | POA: Diagnosis not present

## 2017-07-23 DIAGNOSIS — Z95820 Peripheral vascular angioplasty status with implants and grafts: Secondary | ICD-10-CM | POA: Diagnosis not present

## 2017-07-23 DIAGNOSIS — D225 Melanocytic nevi of trunk: Secondary | ICD-10-CM | POA: Diagnosis not present

## 2017-07-23 DIAGNOSIS — C44311 Basal cell carcinoma of skin of nose: Secondary | ICD-10-CM | POA: Diagnosis not present

## 2017-07-23 DIAGNOSIS — Z9861 Coronary angioplasty status: Secondary | ICD-10-CM | POA: Diagnosis not present

## 2017-07-23 DIAGNOSIS — L570 Actinic keratosis: Secondary | ICD-10-CM | POA: Diagnosis not present

## 2017-07-23 DIAGNOSIS — L821 Other seborrheic keratosis: Secondary | ICD-10-CM | POA: Diagnosis not present

## 2017-07-23 DIAGNOSIS — Z85828 Personal history of other malignant neoplasm of skin: Secondary | ICD-10-CM | POA: Diagnosis not present

## 2017-07-24 DIAGNOSIS — Z95820 Peripheral vascular angioplasty status with implants and grafts: Secondary | ICD-10-CM | POA: Diagnosis not present

## 2017-07-24 DIAGNOSIS — Z9861 Coronary angioplasty status: Secondary | ICD-10-CM | POA: Diagnosis not present

## 2017-07-28 DIAGNOSIS — I2510 Atherosclerotic heart disease of native coronary artery without angina pectoris: Secondary | ICD-10-CM | POA: Diagnosis not present

## 2017-07-28 DIAGNOSIS — I10 Essential (primary) hypertension: Secondary | ICD-10-CM | POA: Diagnosis not present

## 2017-07-28 DIAGNOSIS — M7989 Other specified soft tissue disorders: Secondary | ICD-10-CM | POA: Diagnosis not present

## 2017-07-28 DIAGNOSIS — N183 Chronic kidney disease, stage 3 (moderate): Secondary | ICD-10-CM | POA: Diagnosis not present

## 2017-07-28 DIAGNOSIS — Z9861 Coronary angioplasty status: Secondary | ICD-10-CM | POA: Diagnosis not present

## 2017-07-28 DIAGNOSIS — Z95820 Peripheral vascular angioplasty status with implants and grafts: Secondary | ICD-10-CM | POA: Diagnosis not present

## 2017-07-28 DIAGNOSIS — E785 Hyperlipidemia, unspecified: Secondary | ICD-10-CM | POA: Diagnosis not present

## 2017-07-30 DIAGNOSIS — Z9861 Coronary angioplasty status: Secondary | ICD-10-CM | POA: Diagnosis not present

## 2017-07-30 DIAGNOSIS — Z95820 Peripheral vascular angioplasty status with implants and grafts: Secondary | ICD-10-CM | POA: Diagnosis not present

## 2017-07-31 DIAGNOSIS — Z9861 Coronary angioplasty status: Secondary | ICD-10-CM | POA: Diagnosis not present

## 2017-07-31 DIAGNOSIS — Z95820 Peripheral vascular angioplasty status with implants and grafts: Secondary | ICD-10-CM | POA: Diagnosis not present

## 2017-08-07 DIAGNOSIS — Z95820 Peripheral vascular angioplasty status with implants and grafts: Secondary | ICD-10-CM | POA: Diagnosis not present

## 2017-08-07 DIAGNOSIS — Z9861 Coronary angioplasty status: Secondary | ICD-10-CM | POA: Diagnosis not present

## 2017-08-15 DIAGNOSIS — R69 Illness, unspecified: Secondary | ICD-10-CM | POA: Diagnosis not present

## 2017-08-18 DIAGNOSIS — R7989 Other specified abnormal findings of blood chemistry: Secondary | ICD-10-CM | POA: Diagnosis not present

## 2017-08-18 DIAGNOSIS — E119 Type 2 diabetes mellitus without complications: Secondary | ICD-10-CM | POA: Diagnosis not present

## 2017-08-18 DIAGNOSIS — R5383 Other fatigue: Secondary | ICD-10-CM | POA: Diagnosis not present

## 2017-08-18 DIAGNOSIS — E559 Vitamin D deficiency, unspecified: Secondary | ICD-10-CM | POA: Diagnosis not present

## 2017-08-29 DIAGNOSIS — E119 Type 2 diabetes mellitus without complications: Secondary | ICD-10-CM | POA: Diagnosis not present

## 2017-09-08 DIAGNOSIS — I2510 Atherosclerotic heart disease of native coronary artery without angina pectoris: Secondary | ICD-10-CM | POA: Diagnosis not present

## 2017-09-08 DIAGNOSIS — Z95820 Peripheral vascular angioplasty status with implants and grafts: Secondary | ICD-10-CM | POA: Diagnosis not present

## 2017-09-08 DIAGNOSIS — Z955 Presence of coronary angioplasty implant and graft: Secondary | ICD-10-CM | POA: Diagnosis not present

## 2017-09-08 DIAGNOSIS — Z79899 Other long term (current) drug therapy: Secondary | ICD-10-CM | POA: Diagnosis not present

## 2017-09-09 DIAGNOSIS — C44311 Basal cell carcinoma of skin of nose: Secondary | ICD-10-CM | POA: Diagnosis not present

## 2017-09-15 DIAGNOSIS — Z955 Presence of coronary angioplasty implant and graft: Secondary | ICD-10-CM | POA: Diagnosis not present

## 2017-09-15 DIAGNOSIS — I2510 Atherosclerotic heart disease of native coronary artery without angina pectoris: Secondary | ICD-10-CM | POA: Diagnosis not present

## 2017-09-15 DIAGNOSIS — Z79899 Other long term (current) drug therapy: Secondary | ICD-10-CM | POA: Diagnosis not present

## 2017-09-15 DIAGNOSIS — Z95820 Peripheral vascular angioplasty status with implants and grafts: Secondary | ICD-10-CM | POA: Diagnosis not present

## 2017-09-17 DIAGNOSIS — Z45010 Encounter for checking and testing of cardiac pacemaker pulse generator [battery]: Secondary | ICD-10-CM | POA: Diagnosis not present

## 2017-09-17 DIAGNOSIS — Z955 Presence of coronary angioplasty implant and graft: Secondary | ICD-10-CM | POA: Diagnosis not present

## 2017-09-17 DIAGNOSIS — Z950 Presence of cardiac pacemaker: Secondary | ICD-10-CM | POA: Diagnosis not present

## 2017-09-17 DIAGNOSIS — I2510 Atherosclerotic heart disease of native coronary artery without angina pectoris: Secondary | ICD-10-CM | POA: Diagnosis not present

## 2017-09-17 DIAGNOSIS — Z95820 Peripheral vascular angioplasty status with implants and grafts: Secondary | ICD-10-CM | POA: Diagnosis not present

## 2017-09-17 DIAGNOSIS — Z79899 Other long term (current) drug therapy: Secondary | ICD-10-CM | POA: Diagnosis not present

## 2017-09-18 DIAGNOSIS — Z95820 Peripheral vascular angioplasty status with implants and grafts: Secondary | ICD-10-CM | POA: Diagnosis not present

## 2017-09-18 DIAGNOSIS — Z79899 Other long term (current) drug therapy: Secondary | ICD-10-CM | POA: Diagnosis not present

## 2017-09-18 DIAGNOSIS — I2510 Atherosclerotic heart disease of native coronary artery without angina pectoris: Secondary | ICD-10-CM | POA: Diagnosis not present

## 2017-09-18 DIAGNOSIS — Z955 Presence of coronary angioplasty implant and graft: Secondary | ICD-10-CM | POA: Diagnosis not present

## 2017-09-22 DIAGNOSIS — Z95820 Peripheral vascular angioplasty status with implants and grafts: Secondary | ICD-10-CM | POA: Diagnosis not present

## 2017-09-22 DIAGNOSIS — I2510 Atherosclerotic heart disease of native coronary artery without angina pectoris: Secondary | ICD-10-CM | POA: Diagnosis not present

## 2017-09-22 DIAGNOSIS — R69 Illness, unspecified: Secondary | ICD-10-CM | POA: Diagnosis not present

## 2017-09-22 DIAGNOSIS — Z79899 Other long term (current) drug therapy: Secondary | ICD-10-CM | POA: Diagnosis not present

## 2017-09-22 DIAGNOSIS — Z955 Presence of coronary angioplasty implant and graft: Secondary | ICD-10-CM | POA: Diagnosis not present

## 2017-09-24 DIAGNOSIS — Z79899 Other long term (current) drug therapy: Secondary | ICD-10-CM | POA: Diagnosis not present

## 2017-09-24 DIAGNOSIS — I2510 Atherosclerotic heart disease of native coronary artery without angina pectoris: Secondary | ICD-10-CM | POA: Diagnosis not present

## 2017-09-24 DIAGNOSIS — Z955 Presence of coronary angioplasty implant and graft: Secondary | ICD-10-CM | POA: Diagnosis not present

## 2017-09-24 DIAGNOSIS — Z95820 Peripheral vascular angioplasty status with implants and grafts: Secondary | ICD-10-CM | POA: Diagnosis not present

## 2017-09-25 DIAGNOSIS — Z95820 Peripheral vascular angioplasty status with implants and grafts: Secondary | ICD-10-CM | POA: Diagnosis not present

## 2017-09-25 DIAGNOSIS — Z79899 Other long term (current) drug therapy: Secondary | ICD-10-CM | POA: Diagnosis not present

## 2017-09-25 DIAGNOSIS — I2510 Atherosclerotic heart disease of native coronary artery without angina pectoris: Secondary | ICD-10-CM | POA: Diagnosis not present

## 2017-09-25 DIAGNOSIS — Z955 Presence of coronary angioplasty implant and graft: Secondary | ICD-10-CM | POA: Diagnosis not present

## 2017-09-29 DIAGNOSIS — Z45018 Encounter for adjustment and management of other part of cardiac pacemaker: Secondary | ICD-10-CM | POA: Diagnosis not present

## 2017-09-29 DIAGNOSIS — Z79899 Other long term (current) drug therapy: Secondary | ICD-10-CM | POA: Diagnosis not present

## 2017-09-29 DIAGNOSIS — I498 Other specified cardiac arrhythmias: Secondary | ICD-10-CM | POA: Diagnosis not present

## 2017-09-29 DIAGNOSIS — Z955 Presence of coronary angioplasty implant and graft: Secondary | ICD-10-CM | POA: Diagnosis not present

## 2017-09-29 DIAGNOSIS — I2510 Atherosclerotic heart disease of native coronary artery without angina pectoris: Secondary | ICD-10-CM | POA: Diagnosis not present

## 2017-09-29 DIAGNOSIS — Z95820 Peripheral vascular angioplasty status with implants and grafts: Secondary | ICD-10-CM | POA: Diagnosis not present

## 2017-10-01 DIAGNOSIS — Z95820 Peripheral vascular angioplasty status with implants and grafts: Secondary | ICD-10-CM | POA: Diagnosis not present

## 2017-10-01 DIAGNOSIS — Z955 Presence of coronary angioplasty implant and graft: Secondary | ICD-10-CM | POA: Diagnosis not present

## 2017-10-01 DIAGNOSIS — I2510 Atherosclerotic heart disease of native coronary artery without angina pectoris: Secondary | ICD-10-CM | POA: Diagnosis not present

## 2017-10-01 DIAGNOSIS — Z79899 Other long term (current) drug therapy: Secondary | ICD-10-CM | POA: Diagnosis not present

## 2017-10-02 DIAGNOSIS — I2510 Atherosclerotic heart disease of native coronary artery without angina pectoris: Secondary | ICD-10-CM | POA: Diagnosis not present

## 2017-10-02 DIAGNOSIS — Z95820 Peripheral vascular angioplasty status with implants and grafts: Secondary | ICD-10-CM | POA: Diagnosis not present

## 2017-10-02 DIAGNOSIS — Z79899 Other long term (current) drug therapy: Secondary | ICD-10-CM | POA: Diagnosis not present

## 2017-10-02 DIAGNOSIS — Z955 Presence of coronary angioplasty implant and graft: Secondary | ICD-10-CM | POA: Diagnosis not present

## 2017-10-06 DIAGNOSIS — Z79899 Other long term (current) drug therapy: Secondary | ICD-10-CM | POA: Diagnosis not present

## 2017-10-06 DIAGNOSIS — I2510 Atherosclerotic heart disease of native coronary artery without angina pectoris: Secondary | ICD-10-CM | POA: Diagnosis not present

## 2017-10-06 DIAGNOSIS — Z955 Presence of coronary angioplasty implant and graft: Secondary | ICD-10-CM | POA: Diagnosis not present

## 2017-10-06 DIAGNOSIS — Z95820 Peripheral vascular angioplasty status with implants and grafts: Secondary | ICD-10-CM | POA: Diagnosis not present

## 2017-10-08 DIAGNOSIS — Z95820 Peripheral vascular angioplasty status with implants and grafts: Secondary | ICD-10-CM | POA: Diagnosis not present

## 2017-10-08 DIAGNOSIS — Z79899 Other long term (current) drug therapy: Secondary | ICD-10-CM | POA: Diagnosis not present

## 2017-10-08 DIAGNOSIS — Z955 Presence of coronary angioplasty implant and graft: Secondary | ICD-10-CM | POA: Diagnosis not present

## 2017-10-08 DIAGNOSIS — I2510 Atherosclerotic heart disease of native coronary artery without angina pectoris: Secondary | ICD-10-CM | POA: Diagnosis not present

## 2017-10-09 DIAGNOSIS — Z5189 Encounter for other specified aftercare: Secondary | ICD-10-CM | POA: Diagnosis not present

## 2017-10-09 DIAGNOSIS — Z95820 Peripheral vascular angioplasty status with implants and grafts: Secondary | ICD-10-CM | POA: Diagnosis not present

## 2017-10-13 DIAGNOSIS — Z95820 Peripheral vascular angioplasty status with implants and grafts: Secondary | ICD-10-CM | POA: Diagnosis not present

## 2017-10-13 DIAGNOSIS — Z5189 Encounter for other specified aftercare: Secondary | ICD-10-CM | POA: Diagnosis not present

## 2017-10-24 DIAGNOSIS — N183 Chronic kidney disease, stage 3 (moderate): Secondary | ICD-10-CM | POA: Diagnosis not present

## 2017-10-24 DIAGNOSIS — E785 Hyperlipidemia, unspecified: Secondary | ICD-10-CM | POA: Diagnosis not present

## 2017-10-24 DIAGNOSIS — E1169 Type 2 diabetes mellitus with other specified complication: Secondary | ICD-10-CM | POA: Diagnosis not present

## 2017-10-24 DIAGNOSIS — I10 Essential (primary) hypertension: Secondary | ICD-10-CM | POA: Diagnosis not present

## 2017-10-28 DIAGNOSIS — M6740 Ganglion, unspecified site: Secondary | ICD-10-CM | POA: Diagnosis not present

## 2017-10-28 DIAGNOSIS — L570 Actinic keratosis: Secondary | ICD-10-CM | POA: Diagnosis not present

## 2017-10-28 DIAGNOSIS — Z85828 Personal history of other malignant neoplasm of skin: Secondary | ICD-10-CM | POA: Diagnosis not present

## 2017-11-13 DIAGNOSIS — M6740 Ganglion, unspecified site: Secondary | ICD-10-CM | POA: Diagnosis not present

## 2017-11-13 DIAGNOSIS — R69 Illness, unspecified: Secondary | ICD-10-CM | POA: Diagnosis not present

## 2017-11-25 DIAGNOSIS — E559 Vitamin D deficiency, unspecified: Secondary | ICD-10-CM | POA: Diagnosis not present

## 2017-11-25 DIAGNOSIS — R5383 Other fatigue: Secondary | ICD-10-CM | POA: Diagnosis not present

## 2017-11-25 DIAGNOSIS — R7989 Other specified abnormal findings of blood chemistry: Secondary | ICD-10-CM | POA: Diagnosis not present

## 2017-11-25 DIAGNOSIS — E119 Type 2 diabetes mellitus without complications: Secondary | ICD-10-CM | POA: Diagnosis not present

## 2017-12-02 DIAGNOSIS — E119 Type 2 diabetes mellitus without complications: Secondary | ICD-10-CM | POA: Diagnosis not present

## 2017-12-11 DIAGNOSIS — Z23 Encounter for immunization: Secondary | ICD-10-CM | POA: Diagnosis not present

## 2017-12-11 DIAGNOSIS — L570 Actinic keratosis: Secondary | ICD-10-CM | POA: Diagnosis not present

## 2017-12-11 DIAGNOSIS — M2550 Pain in unspecified joint: Secondary | ICD-10-CM | POA: Diagnosis not present

## 2017-12-11 DIAGNOSIS — M6740 Ganglion, unspecified site: Secondary | ICD-10-CM | POA: Diagnosis not present

## 2017-12-11 DIAGNOSIS — L821 Other seborrheic keratosis: Secondary | ICD-10-CM | POA: Diagnosis not present

## 2017-12-12 DIAGNOSIS — K921 Melena: Secondary | ICD-10-CM | POA: Diagnosis not present

## 2017-12-12 DIAGNOSIS — K219 Gastro-esophageal reflux disease without esophagitis: Secondary | ICD-10-CM | POA: Diagnosis not present

## 2017-12-16 DIAGNOSIS — Z7901 Long term (current) use of anticoagulants: Secondary | ICD-10-CM | POA: Diagnosis not present

## 2017-12-16 DIAGNOSIS — K921 Melena: Secondary | ICD-10-CM | POA: Diagnosis not present

## 2017-12-17 DIAGNOSIS — Z950 Presence of cardiac pacemaker: Secondary | ICD-10-CM | POA: Diagnosis not present

## 2017-12-21 DIAGNOSIS — R69 Illness, unspecified: Secondary | ICD-10-CM | POA: Diagnosis not present

## 2017-12-30 DIAGNOSIS — K921 Melena: Secondary | ICD-10-CM | POA: Diagnosis not present

## 2017-12-31 DIAGNOSIS — M6740 Ganglion, unspecified site: Secondary | ICD-10-CM | POA: Diagnosis not present

## 2017-12-31 DIAGNOSIS — M19041 Primary osteoarthritis, right hand: Secondary | ICD-10-CM | POA: Diagnosis not present

## 2017-12-31 DIAGNOSIS — R229 Localized swelling, mass and lump, unspecified: Secondary | ICD-10-CM | POA: Diagnosis not present

## 2018-01-13 DIAGNOSIS — K921 Melena: Secondary | ICD-10-CM | POA: Diagnosis not present

## 2018-01-13 DIAGNOSIS — I10 Essential (primary) hypertension: Secondary | ICD-10-CM | POA: Diagnosis not present

## 2018-01-27 DIAGNOSIS — M67431 Ganglion, right wrist: Secondary | ICD-10-CM | POA: Diagnosis not present

## 2018-02-07 DIAGNOSIS — R69 Illness, unspecified: Secondary | ICD-10-CM | POA: Diagnosis not present

## 2018-02-25 DIAGNOSIS — H01025 Squamous blepharitis left lower eyelid: Secondary | ICD-10-CM | POA: Diagnosis not present

## 2018-02-25 DIAGNOSIS — L570 Actinic keratosis: Secondary | ICD-10-CM | POA: Diagnosis not present

## 2018-02-25 DIAGNOSIS — Z85828 Personal history of other malignant neoplasm of skin: Secondary | ICD-10-CM | POA: Diagnosis not present

## 2018-02-25 DIAGNOSIS — Q111 Other anophthalmos: Secondary | ICD-10-CM | POA: Diagnosis not present

## 2018-02-25 DIAGNOSIS — L820 Inflamed seborrheic keratosis: Secondary | ICD-10-CM | POA: Diagnosis not present

## 2018-02-25 DIAGNOSIS — S0502XA Injury of conjunctiva and corneal abrasion without foreign body, left eye, initial encounter: Secondary | ICD-10-CM | POA: Diagnosis not present

## 2018-02-25 DIAGNOSIS — H01022 Squamous blepharitis right lower eyelid: Secondary | ICD-10-CM | POA: Diagnosis not present

## 2018-02-25 DIAGNOSIS — C4440 Unspecified malignant neoplasm of skin of scalp and neck: Secondary | ICD-10-CM | POA: Diagnosis not present

## 2018-02-25 DIAGNOSIS — H01021 Squamous blepharitis right upper eyelid: Secondary | ICD-10-CM | POA: Diagnosis not present

## 2018-02-25 DIAGNOSIS — H17822 Peripheral opacity of cornea, left eye: Secondary | ICD-10-CM | POA: Diagnosis not present

## 2018-02-25 DIAGNOSIS — D485 Neoplasm of uncertain behavior of skin: Secondary | ICD-10-CM | POA: Diagnosis not present

## 2018-02-25 DIAGNOSIS — D225 Melanocytic nevi of trunk: Secondary | ICD-10-CM | POA: Diagnosis not present

## 2018-02-25 DIAGNOSIS — M6740 Ganglion, unspecified site: Secondary | ICD-10-CM | POA: Diagnosis not present

## 2018-02-25 DIAGNOSIS — L821 Other seborrheic keratosis: Secondary | ICD-10-CM | POA: Diagnosis not present

## 2018-02-25 DIAGNOSIS — H01024 Squamous blepharitis left upper eyelid: Secondary | ICD-10-CM | POA: Diagnosis not present

## 2018-02-25 DIAGNOSIS — Z23 Encounter for immunization: Secondary | ICD-10-CM | POA: Diagnosis not present

## 2018-02-25 DIAGNOSIS — C4442 Squamous cell carcinoma of skin of scalp and neck: Secondary | ICD-10-CM | POA: Diagnosis not present

## 2018-03-02 DIAGNOSIS — H01025 Squamous blepharitis left lower eyelid: Secondary | ICD-10-CM | POA: Diagnosis not present

## 2018-03-02 DIAGNOSIS — H01022 Squamous blepharitis right lower eyelid: Secondary | ICD-10-CM | POA: Diagnosis not present

## 2018-03-02 DIAGNOSIS — S0502XA Injury of conjunctiva and corneal abrasion without foreign body, left eye, initial encounter: Secondary | ICD-10-CM | POA: Diagnosis not present

## 2018-03-02 DIAGNOSIS — H01024 Squamous blepharitis left upper eyelid: Secondary | ICD-10-CM | POA: Diagnosis not present

## 2018-03-02 DIAGNOSIS — H01021 Squamous blepharitis right upper eyelid: Secondary | ICD-10-CM | POA: Diagnosis not present

## 2018-03-02 DIAGNOSIS — H17822 Peripheral opacity of cornea, left eye: Secondary | ICD-10-CM | POA: Diagnosis not present

## 2018-03-10 DIAGNOSIS — E119 Type 2 diabetes mellitus without complications: Secondary | ICD-10-CM | POA: Diagnosis not present

## 2018-03-10 DIAGNOSIS — R5383 Other fatigue: Secondary | ICD-10-CM | POA: Diagnosis not present

## 2018-03-10 DIAGNOSIS — R7989 Other specified abnormal findings of blood chemistry: Secondary | ICD-10-CM | POA: Diagnosis not present

## 2018-03-10 DIAGNOSIS — E559 Vitamin D deficiency, unspecified: Secondary | ICD-10-CM | POA: Diagnosis not present

## 2018-03-17 DIAGNOSIS — E119 Type 2 diabetes mellitus without complications: Secondary | ICD-10-CM | POA: Diagnosis not present

## 2018-03-18 DIAGNOSIS — Z950 Presence of cardiac pacemaker: Secondary | ICD-10-CM | POA: Diagnosis not present

## 2018-03-24 DIAGNOSIS — D485 Neoplasm of uncertain behavior of skin: Secondary | ICD-10-CM | POA: Diagnosis not present

## 2018-03-24 DIAGNOSIS — C4449 Other specified malignant neoplasm of skin of scalp and neck: Secondary | ICD-10-CM | POA: Diagnosis not present

## 2018-03-24 DIAGNOSIS — C4442 Squamous cell carcinoma of skin of scalp and neck: Secondary | ICD-10-CM | POA: Diagnosis not present

## 2018-03-24 DIAGNOSIS — C44329 Squamous cell carcinoma of skin of other parts of face: Secondary | ICD-10-CM | POA: Diagnosis not present

## 2018-03-27 DIAGNOSIS — H01021 Squamous blepharitis right upper eyelid: Secondary | ICD-10-CM | POA: Diagnosis not present

## 2018-03-27 DIAGNOSIS — H02843 Edema of right eye, unspecified eyelid: Secondary | ICD-10-CM | POA: Diagnosis not present

## 2018-03-27 DIAGNOSIS — S0502XA Injury of conjunctiva and corneal abrasion without foreign body, left eye, initial encounter: Secondary | ICD-10-CM | POA: Diagnosis not present

## 2018-03-27 DIAGNOSIS — H01022 Squamous blepharitis right lower eyelid: Secondary | ICD-10-CM | POA: Diagnosis not present

## 2018-03-27 DIAGNOSIS — H01024 Squamous blepharitis left upper eyelid: Secondary | ICD-10-CM | POA: Diagnosis not present

## 2018-03-27 DIAGNOSIS — H01025 Squamous blepharitis left lower eyelid: Secondary | ICD-10-CM | POA: Diagnosis not present

## 2018-03-27 DIAGNOSIS — H17822 Peripheral opacity of cornea, left eye: Secondary | ICD-10-CM | POA: Diagnosis not present

## 2018-03-30 DIAGNOSIS — R69 Illness, unspecified: Secondary | ICD-10-CM | POA: Diagnosis not present

## 2018-04-14 DIAGNOSIS — S0100XA Unspecified open wound of scalp, initial encounter: Secondary | ICD-10-CM | POA: Diagnosis not present

## 2018-04-18 DIAGNOSIS — E119 Type 2 diabetes mellitus without complications: Secondary | ICD-10-CM | POA: Diagnosis not present

## 2018-04-18 DIAGNOSIS — I498 Other specified cardiac arrhythmias: Secondary | ICD-10-CM | POA: Diagnosis not present

## 2018-04-18 DIAGNOSIS — I10 Essential (primary) hypertension: Secondary | ICD-10-CM | POA: Diagnosis not present

## 2018-04-18 DIAGNOSIS — Z8719 Personal history of other diseases of the digestive system: Secondary | ICD-10-CM | POA: Diagnosis not present

## 2018-04-18 DIAGNOSIS — R109 Unspecified abdominal pain: Secondary | ICD-10-CM | POA: Diagnosis not present

## 2018-04-18 DIAGNOSIS — R112 Nausea with vomiting, unspecified: Secondary | ICD-10-CM | POA: Diagnosis not present

## 2018-04-18 DIAGNOSIS — K819 Cholecystitis, unspecified: Secondary | ICD-10-CM | POA: Diagnosis not present

## 2018-04-18 DIAGNOSIS — Z955 Presence of coronary angioplasty implant and graft: Secondary | ICD-10-CM | POA: Diagnosis not present

## 2018-04-18 DIAGNOSIS — N179 Acute kidney failure, unspecified: Secondary | ICD-10-CM | POA: Diagnosis not present

## 2018-04-18 DIAGNOSIS — R1084 Generalized abdominal pain: Secondary | ICD-10-CM | POA: Diagnosis not present

## 2018-04-18 DIAGNOSIS — I459 Conduction disorder, unspecified: Secondary | ICD-10-CM | POA: Diagnosis not present

## 2018-04-18 DIAGNOSIS — E785 Hyperlipidemia, unspecified: Secondary | ICD-10-CM | POA: Diagnosis not present

## 2018-04-18 DIAGNOSIS — R5381 Other malaise: Secondary | ICD-10-CM | POA: Diagnosis not present

## 2018-04-18 DIAGNOSIS — R1011 Right upper quadrant pain: Secondary | ICD-10-CM | POA: Diagnosis not present

## 2018-04-18 DIAGNOSIS — K760 Fatty (change of) liver, not elsewhere classified: Secondary | ICD-10-CM | POA: Diagnosis not present

## 2018-04-18 DIAGNOSIS — Z950 Presence of cardiac pacemaker: Secondary | ICD-10-CM | POA: Diagnosis not present

## 2018-04-18 DIAGNOSIS — K449 Diaphragmatic hernia without obstruction or gangrene: Secondary | ICD-10-CM | POA: Diagnosis not present

## 2018-04-18 DIAGNOSIS — I2510 Atherosclerotic heart disease of native coronary artery without angina pectoris: Secondary | ICD-10-CM | POA: Diagnosis not present

## 2018-04-19 DIAGNOSIS — Z85828 Personal history of other malignant neoplasm of skin: Secondary | ICD-10-CM | POA: Diagnosis not present

## 2018-04-19 DIAGNOSIS — Z8719 Personal history of other diseases of the digestive system: Secondary | ICD-10-CM | POA: Diagnosis not present

## 2018-04-19 DIAGNOSIS — E119 Type 2 diabetes mellitus without complications: Secondary | ICD-10-CM | POA: Diagnosis not present

## 2018-04-19 DIAGNOSIS — R109 Unspecified abdominal pain: Secondary | ICD-10-CM | POA: Diagnosis not present

## 2018-04-19 DIAGNOSIS — I10 Essential (primary) hypertension: Secondary | ICD-10-CM | POA: Diagnosis not present

## 2018-04-19 DIAGNOSIS — Z950 Presence of cardiac pacemaker: Secondary | ICD-10-CM | POA: Diagnosis not present

## 2018-04-19 DIAGNOSIS — E785 Hyperlipidemia, unspecified: Secondary | ICD-10-CM | POA: Diagnosis not present

## 2018-04-19 DIAGNOSIS — Z955 Presence of coronary angioplasty implant and graft: Secondary | ICD-10-CM | POA: Diagnosis not present

## 2018-04-19 DIAGNOSIS — I2510 Atherosclerotic heart disease of native coronary artery without angina pectoris: Secondary | ICD-10-CM | POA: Diagnosis not present

## 2018-04-19 DIAGNOSIS — L03811 Cellulitis of head [any part, except face]: Secondary | ICD-10-CM | POA: Diagnosis not present

## 2018-04-20 DIAGNOSIS — I495 Sick sinus syndrome: Secondary | ICD-10-CM | POA: Diagnosis not present

## 2018-04-20 DIAGNOSIS — R9389 Abnormal findings on diagnostic imaging of other specified body structures: Secondary | ICD-10-CM | POA: Diagnosis not present

## 2018-04-20 DIAGNOSIS — N2889 Other specified disorders of kidney and ureter: Secondary | ICD-10-CM | POA: Diagnosis not present

## 2018-04-20 DIAGNOSIS — Z95818 Presence of other cardiac implants and grafts: Secondary | ICD-10-CM | POA: Diagnosis not present

## 2018-04-20 DIAGNOSIS — Z978 Presence of other specified devices: Secondary | ICD-10-CM | POA: Diagnosis not present

## 2018-04-20 DIAGNOSIS — N179 Acute kidney failure, unspecified: Secondary | ICD-10-CM | POA: Diagnosis not present

## 2018-04-20 DIAGNOSIS — Z955 Presence of coronary angioplasty implant and graft: Secondary | ICD-10-CM | POA: Diagnosis not present

## 2018-04-20 DIAGNOSIS — K810 Acute cholecystitis: Secondary | ICD-10-CM | POA: Diagnosis not present

## 2018-04-20 DIAGNOSIS — M19041 Primary osteoarthritis, right hand: Secondary | ICD-10-CM | POA: Diagnosis not present

## 2018-04-20 DIAGNOSIS — B958 Unspecified staphylococcus as the cause of diseases classified elsewhere: Secondary | ICD-10-CM | POA: Diagnosis not present

## 2018-04-20 DIAGNOSIS — Z89112 Acquired absence of left hand: Secondary | ICD-10-CM | POA: Diagnosis not present

## 2018-04-20 DIAGNOSIS — Z8719 Personal history of other diseases of the digestive system: Secondary | ICD-10-CM | POA: Diagnosis not present

## 2018-04-20 DIAGNOSIS — K921 Melena: Secondary | ICD-10-CM | POA: Diagnosis not present

## 2018-04-20 DIAGNOSIS — Z85828 Personal history of other malignant neoplasm of skin: Secondary | ICD-10-CM | POA: Diagnosis not present

## 2018-04-20 DIAGNOSIS — E785 Hyperlipidemia, unspecified: Secondary | ICD-10-CM | POA: Diagnosis not present

## 2018-04-20 DIAGNOSIS — N183 Chronic kidney disease, stage 3 (moderate): Secondary | ICD-10-CM | POA: Diagnosis not present

## 2018-04-20 DIAGNOSIS — Z7902 Long term (current) use of antithrombotics/antiplatelets: Secondary | ICD-10-CM | POA: Diagnosis not present

## 2018-04-20 DIAGNOSIS — K819 Cholecystitis, unspecified: Secondary | ICD-10-CM | POA: Diagnosis not present

## 2018-04-20 DIAGNOSIS — N400 Enlarged prostate without lower urinary tract symptoms: Secondary | ICD-10-CM | POA: Diagnosis not present

## 2018-04-20 DIAGNOSIS — I442 Atrioventricular block, complete: Secondary | ICD-10-CM | POA: Diagnosis not present

## 2018-04-20 DIAGNOSIS — R10817 Generalized abdominal tenderness: Secondary | ICD-10-CM | POA: Diagnosis not present

## 2018-04-20 DIAGNOSIS — R10811 Right upper quadrant abdominal tenderness: Secondary | ICD-10-CM | POA: Diagnosis not present

## 2018-04-20 DIAGNOSIS — I130 Hypertensive heart and chronic kidney disease with heart failure and stage 1 through stage 4 chronic kidney disease, or unspecified chronic kidney disease: Secondary | ICD-10-CM | POA: Diagnosis not present

## 2018-04-20 DIAGNOSIS — I10 Essential (primary) hypertension: Secondary | ICD-10-CM | POA: Diagnosis not present

## 2018-04-20 DIAGNOSIS — I129 Hypertensive chronic kidney disease with stage 1 through stage 4 chronic kidney disease, or unspecified chronic kidney disease: Secondary | ICD-10-CM | POA: Diagnosis not present

## 2018-04-20 DIAGNOSIS — I2510 Atherosclerotic heart disease of native coronary artery without angina pectoris: Secondary | ICD-10-CM | POA: Diagnosis not present

## 2018-04-20 DIAGNOSIS — N289 Disorder of kidney and ureter, unspecified: Secondary | ICD-10-CM | POA: Diagnosis not present

## 2018-04-20 DIAGNOSIS — L03811 Cellulitis of head [any part, except face]: Secondary | ICD-10-CM | POA: Diagnosis not present

## 2018-04-20 DIAGNOSIS — Y849 Medical procedure, unspecified as the cause of abnormal reaction of the patient, or of later complication, without mention of misadventure at the time of the procedure: Secondary | ICD-10-CM | POA: Diagnosis not present

## 2018-04-20 DIAGNOSIS — R1011 Right upper quadrant pain: Secondary | ICD-10-CM | POA: Diagnosis not present

## 2018-04-20 DIAGNOSIS — R69 Illness, unspecified: Secondary | ICD-10-CM | POA: Diagnosis not present

## 2018-04-20 DIAGNOSIS — R410 Disorientation, unspecified: Secondary | ICD-10-CM | POA: Diagnosis not present

## 2018-04-20 DIAGNOSIS — Z950 Presence of cardiac pacemaker: Secondary | ICD-10-CM | POA: Diagnosis not present

## 2018-04-20 DIAGNOSIS — R109 Unspecified abdominal pain: Secondary | ICD-10-CM | POA: Diagnosis not present

## 2018-04-20 DIAGNOSIS — I509 Heart failure, unspecified: Secondary | ICD-10-CM | POA: Diagnosis not present

## 2018-04-20 DIAGNOSIS — R10814 Left lower quadrant abdominal tenderness: Secondary | ICD-10-CM | POA: Diagnosis not present

## 2018-04-20 DIAGNOSIS — Z452 Encounter for adjustment and management of vascular access device: Secondary | ICD-10-CM | POA: Diagnosis not present

## 2018-04-20 DIAGNOSIS — R1084 Generalized abdominal pain: Secondary | ICD-10-CM | POA: Diagnosis not present

## 2018-04-20 DIAGNOSIS — T8140XA Infection following a procedure, unspecified, initial encounter: Secondary | ICD-10-CM | POA: Diagnosis not present

## 2018-04-20 DIAGNOSIS — Z4659 Encounter for fitting and adjustment of other gastrointestinal appliance and device: Secondary | ICD-10-CM | POA: Diagnosis not present

## 2018-04-20 DIAGNOSIS — B964 Proteus (mirabilis) (morganii) as the cause of diseases classified elsewhere: Secondary | ICD-10-CM | POA: Diagnosis not present

## 2018-04-20 DIAGNOSIS — Z9001 Acquired absence of eye: Secondary | ICD-10-CM | POA: Diagnosis not present

## 2018-04-20 DIAGNOSIS — R195 Other fecal abnormalities: Secondary | ICD-10-CM | POA: Diagnosis not present

## 2018-04-20 DIAGNOSIS — E119 Type 2 diabetes mellitus without complications: Secondary | ICD-10-CM | POA: Diagnosis not present

## 2018-04-20 DIAGNOSIS — D649 Anemia, unspecified: Secondary | ICD-10-CM | POA: Diagnosis not present

## 2018-04-20 DIAGNOSIS — R932 Abnormal findings on diagnostic imaging of liver and biliary tract: Secondary | ICD-10-CM | POA: Diagnosis not present

## 2018-04-20 DIAGNOSIS — N189 Chronic kidney disease, unspecified: Secondary | ICD-10-CM | POA: Diagnosis not present

## 2018-04-21 DIAGNOSIS — N2889 Other specified disorders of kidney and ureter: Secondary | ICD-10-CM | POA: Diagnosis not present

## 2018-04-21 DIAGNOSIS — N289 Disorder of kidney and ureter, unspecified: Secondary | ICD-10-CM | POA: Diagnosis not present

## 2018-04-21 DIAGNOSIS — R1011 Right upper quadrant pain: Secondary | ICD-10-CM | POA: Diagnosis not present

## 2018-04-27 ENCOUNTER — Other Ambulatory Visit: Payer: Self-pay | Admitting: Physician Assistant

## 2018-04-27 DIAGNOSIS — K810 Acute cholecystitis: Secondary | ICD-10-CM

## 2018-05-01 DIAGNOSIS — I10 Essential (primary) hypertension: Secondary | ICD-10-CM | POA: Diagnosis not present

## 2018-05-01 DIAGNOSIS — E1169 Type 2 diabetes mellitus with other specified complication: Secondary | ICD-10-CM | POA: Diagnosis not present

## 2018-05-01 DIAGNOSIS — I129 Hypertensive chronic kidney disease with stage 1 through stage 4 chronic kidney disease, or unspecified chronic kidney disease: Secondary | ICD-10-CM | POA: Diagnosis not present

## 2018-05-01 DIAGNOSIS — E785 Hyperlipidemia, unspecified: Secondary | ICD-10-CM | POA: Diagnosis not present

## 2018-05-01 DIAGNOSIS — K219 Gastro-esophageal reflux disease without esophagitis: Secondary | ICD-10-CM | POA: Diagnosis not present

## 2018-05-01 DIAGNOSIS — I2510 Atherosclerotic heart disease of native coronary artery without angina pectoris: Secondary | ICD-10-CM | POA: Diagnosis not present

## 2018-05-01 DIAGNOSIS — I5041 Acute combined systolic (congestive) and diastolic (congestive) heart failure: Secondary | ICD-10-CM | POA: Diagnosis not present

## 2018-05-01 DIAGNOSIS — Z950 Presence of cardiac pacemaker: Secondary | ICD-10-CM | POA: Diagnosis not present

## 2018-05-01 DIAGNOSIS — K810 Acute cholecystitis: Secondary | ICD-10-CM | POA: Diagnosis not present

## 2018-05-01 DIAGNOSIS — Z0000 Encounter for general adult medical examination without abnormal findings: Secondary | ICD-10-CM | POA: Diagnosis not present

## 2018-05-01 DIAGNOSIS — Z1389 Encounter for screening for other disorder: Secondary | ICD-10-CM | POA: Diagnosis not present

## 2018-05-02 DIAGNOSIS — I130 Hypertensive heart and chronic kidney disease with heart failure and stage 1 through stage 4 chronic kidney disease, or unspecified chronic kidney disease: Secondary | ICD-10-CM | POA: Diagnosis not present

## 2018-05-02 DIAGNOSIS — E1122 Type 2 diabetes mellitus with diabetic chronic kidney disease: Secondary | ICD-10-CM | POA: Diagnosis not present

## 2018-05-02 DIAGNOSIS — I509 Heart failure, unspecified: Secondary | ICD-10-CM | POA: Diagnosis not present

## 2018-05-02 DIAGNOSIS — N183 Chronic kidney disease, stage 3 (moderate): Secondary | ICD-10-CM | POA: Diagnosis not present

## 2018-05-02 DIAGNOSIS — T8149XA Infection following a procedure, other surgical site, initial encounter: Secondary | ICD-10-CM | POA: Diagnosis not present

## 2018-05-04 DIAGNOSIS — J101 Influenza due to other identified influenza virus with other respiratory manifestations: Secondary | ICD-10-CM | POA: Diagnosis not present

## 2018-05-04 DIAGNOSIS — R05 Cough: Secondary | ICD-10-CM | POA: Diagnosis not present

## 2018-05-04 DIAGNOSIS — K810 Acute cholecystitis: Secondary | ICD-10-CM | POA: Diagnosis not present

## 2018-05-04 DIAGNOSIS — E1169 Type 2 diabetes mellitus with other specified complication: Secondary | ICD-10-CM | POA: Diagnosis not present

## 2018-05-04 DIAGNOSIS — I2510 Atherosclerotic heart disease of native coronary artery without angina pectoris: Secondary | ICD-10-CM | POA: Diagnosis not present

## 2018-05-05 DIAGNOSIS — S0100XA Unspecified open wound of scalp, initial encounter: Secondary | ICD-10-CM | POA: Diagnosis not present

## 2018-05-05 DIAGNOSIS — Z4801 Encounter for change or removal of surgical wound dressing: Secondary | ICD-10-CM | POA: Diagnosis not present

## 2018-05-05 DIAGNOSIS — C44329 Squamous cell carcinoma of skin of other parts of face: Secondary | ICD-10-CM | POA: Diagnosis not present

## 2018-05-06 DIAGNOSIS — R531 Weakness: Secondary | ICD-10-CM | POA: Diagnosis not present

## 2018-05-06 DIAGNOSIS — R509 Fever, unspecified: Secondary | ICD-10-CM | POA: Diagnosis not present

## 2018-05-06 DIAGNOSIS — R05 Cough: Secondary | ICD-10-CM | POA: Diagnosis not present

## 2018-05-06 DIAGNOSIS — R630 Anorexia: Secondary | ICD-10-CM | POA: Diagnosis not present

## 2018-05-06 DIAGNOSIS — J90 Pleural effusion, not elsewhere classified: Secondary | ICD-10-CM | POA: Diagnosis not present

## 2018-05-06 DIAGNOSIS — R7309 Other abnormal glucose: Secondary | ICD-10-CM | POA: Diagnosis not present

## 2018-05-06 DIAGNOSIS — J101 Influenza due to other identified influenza virus with other respiratory manifestations: Secondary | ICD-10-CM | POA: Diagnosis not present

## 2018-05-06 DIAGNOSIS — I700 Atherosclerosis of aorta: Secondary | ICD-10-CM | POA: Diagnosis not present

## 2018-05-11 DIAGNOSIS — Z79899 Other long term (current) drug therapy: Secondary | ICD-10-CM | POA: Diagnosis not present

## 2018-05-11 DIAGNOSIS — K819 Cholecystitis, unspecified: Secondary | ICD-10-CM | POA: Diagnosis not present

## 2018-05-11 DIAGNOSIS — D649 Anemia, unspecified: Secondary | ICD-10-CM | POA: Diagnosis not present

## 2018-05-11 DIAGNOSIS — Z7984 Long term (current) use of oral hypoglycemic drugs: Secondary | ICD-10-CM | POA: Diagnosis not present

## 2018-05-11 DIAGNOSIS — R197 Diarrhea, unspecified: Secondary | ICD-10-CM | POA: Diagnosis not present

## 2018-05-11 DIAGNOSIS — R634 Abnormal weight loss: Secondary | ICD-10-CM | POA: Diagnosis not present

## 2018-05-12 ENCOUNTER — Other Ambulatory Visit: Payer: Self-pay | Admitting: Physician Assistant

## 2018-05-12 DIAGNOSIS — K810 Acute cholecystitis: Secondary | ICD-10-CM

## 2018-05-14 DIAGNOSIS — N183 Chronic kidney disease, stage 3 (moderate): Secondary | ICD-10-CM | POA: Diagnosis not present

## 2018-05-14 DIAGNOSIS — I509 Heart failure, unspecified: Secondary | ICD-10-CM | POA: Diagnosis not present

## 2018-05-14 DIAGNOSIS — T8149XA Infection following a procedure, other surgical site, initial encounter: Secondary | ICD-10-CM | POA: Diagnosis not present

## 2018-05-14 DIAGNOSIS — I130 Hypertensive heart and chronic kidney disease with heart failure and stage 1 through stage 4 chronic kidney disease, or unspecified chronic kidney disease: Secondary | ICD-10-CM | POA: Diagnosis not present

## 2018-05-14 DIAGNOSIS — E1122 Type 2 diabetes mellitus with diabetic chronic kidney disease: Secondary | ICD-10-CM | POA: Diagnosis not present

## 2018-05-16 DIAGNOSIS — E860 Dehydration: Secondary | ICD-10-CM | POA: Diagnosis not present

## 2018-05-16 DIAGNOSIS — R05 Cough: Secondary | ICD-10-CM | POA: Diagnosis not present

## 2018-05-16 DIAGNOSIS — N179 Acute kidney failure, unspecified: Secondary | ICD-10-CM | POA: Diagnosis not present

## 2018-05-16 DIAGNOSIS — Z7982 Long term (current) use of aspirin: Secondary | ICD-10-CM | POA: Diagnosis not present

## 2018-05-16 DIAGNOSIS — Z7984 Long term (current) use of oral hypoglycemic drugs: Secondary | ICD-10-CM | POA: Diagnosis not present

## 2018-05-16 DIAGNOSIS — N289 Disorder of kidney and ureter, unspecified: Secondary | ICD-10-CM | POA: Diagnosis not present

## 2018-05-16 DIAGNOSIS — R5381 Other malaise: Secondary | ICD-10-CM | POA: Diagnosis not present

## 2018-05-16 DIAGNOSIS — K219 Gastro-esophageal reflux disease without esophagitis: Secondary | ICD-10-CM | POA: Diagnosis not present

## 2018-05-16 DIAGNOSIS — R69 Illness, unspecified: Secondary | ICD-10-CM | POA: Diagnosis not present

## 2018-05-16 DIAGNOSIS — Z7409 Other reduced mobility: Secondary | ICD-10-CM | POA: Diagnosis not present

## 2018-05-16 DIAGNOSIS — R109 Unspecified abdominal pain: Secondary | ICD-10-CM | POA: Diagnosis not present

## 2018-05-16 DIAGNOSIS — D649 Anemia, unspecified: Secondary | ICD-10-CM | POA: Diagnosis not present

## 2018-05-16 DIAGNOSIS — E1122 Type 2 diabetes mellitus with diabetic chronic kidney disease: Secondary | ICD-10-CM | POA: Diagnosis not present

## 2018-05-16 DIAGNOSIS — Z950 Presence of cardiac pacemaker: Secondary | ICD-10-CM | POA: Diagnosis not present

## 2018-05-16 DIAGNOSIS — I130 Hypertensive heart and chronic kidney disease with heart failure and stage 1 through stage 4 chronic kidney disease, or unspecified chronic kidney disease: Secondary | ICD-10-CM | POA: Diagnosis not present

## 2018-05-16 DIAGNOSIS — E871 Hypo-osmolality and hyponatremia: Secondary | ICD-10-CM | POA: Diagnosis not present

## 2018-05-16 DIAGNOSIS — I129 Hypertensive chronic kidney disease with stage 1 through stage 4 chronic kidney disease, or unspecified chronic kidney disease: Secondary | ICD-10-CM | POA: Diagnosis not present

## 2018-05-16 DIAGNOSIS — R6883 Chills (without fever): Secondary | ICD-10-CM | POA: Diagnosis not present

## 2018-05-16 DIAGNOSIS — I2510 Atherosclerotic heart disease of native coronary artery without angina pectoris: Secondary | ICD-10-CM | POA: Diagnosis not present

## 2018-05-16 DIAGNOSIS — N183 Chronic kidney disease, stage 3 (moderate): Secondary | ICD-10-CM | POA: Diagnosis not present

## 2018-05-16 DIAGNOSIS — E1165 Type 2 diabetes mellitus with hyperglycemia: Secondary | ICD-10-CM | POA: Diagnosis not present

## 2018-05-16 DIAGNOSIS — K5730 Diverticulosis of large intestine without perforation or abscess without bleeding: Secondary | ICD-10-CM | POA: Diagnosis not present

## 2018-05-16 DIAGNOSIS — E7800 Pure hypercholesterolemia, unspecified: Secondary | ICD-10-CM | POA: Diagnosis not present

## 2018-05-16 DIAGNOSIS — E876 Hypokalemia: Secondary | ICD-10-CM | POA: Diagnosis not present

## 2018-05-17 DIAGNOSIS — I129 Hypertensive chronic kidney disease with stage 1 through stage 4 chronic kidney disease, or unspecified chronic kidney disease: Secondary | ICD-10-CM | POA: Diagnosis not present

## 2018-05-17 DIAGNOSIS — Z950 Presence of cardiac pacemaker: Secondary | ICD-10-CM | POA: Diagnosis not present

## 2018-05-17 DIAGNOSIS — N183 Chronic kidney disease, stage 3 (moderate): Secondary | ICD-10-CM | POA: Diagnosis not present

## 2018-05-17 DIAGNOSIS — N179 Acute kidney failure, unspecified: Secondary | ICD-10-CM | POA: Diagnosis not present

## 2018-05-17 DIAGNOSIS — E1165 Type 2 diabetes mellitus with hyperglycemia: Secondary | ICD-10-CM | POA: Diagnosis not present

## 2018-05-17 DIAGNOSIS — I2510 Atherosclerotic heart disease of native coronary artery without angina pectoris: Secondary | ICD-10-CM | POA: Diagnosis not present

## 2018-05-18 DIAGNOSIS — R69 Illness, unspecified: Secondary | ICD-10-CM | POA: Diagnosis not present

## 2018-05-18 DIAGNOSIS — N179 Acute kidney failure, unspecified: Secondary | ICD-10-CM | POA: Diagnosis not present

## 2018-05-18 DIAGNOSIS — E1165 Type 2 diabetes mellitus with hyperglycemia: Secondary | ICD-10-CM | POA: Diagnosis not present

## 2018-05-18 DIAGNOSIS — I2510 Atherosclerotic heart disease of native coronary artery without angina pectoris: Secondary | ICD-10-CM | POA: Diagnosis not present

## 2018-05-18 DIAGNOSIS — Z950 Presence of cardiac pacemaker: Secondary | ICD-10-CM | POA: Diagnosis not present

## 2018-05-18 DIAGNOSIS — R5381 Other malaise: Secondary | ICD-10-CM | POA: Diagnosis not present

## 2018-05-18 DIAGNOSIS — N183 Chronic kidney disease, stage 3 (moderate): Secondary | ICD-10-CM | POA: Diagnosis not present

## 2018-05-18 DIAGNOSIS — E1122 Type 2 diabetes mellitus with diabetic chronic kidney disease: Secondary | ICD-10-CM | POA: Diagnosis not present

## 2018-05-20 ENCOUNTER — Ambulatory Visit
Admission: RE | Admit: 2018-05-20 | Discharge: 2018-05-20 | Disposition: A | Discharge status: Home / Self Care | Payer: Medicare HMO | Source: Ambulatory Visit | Attending: Physician Assistant | Admitting: Physician Assistant

## 2018-05-20 ENCOUNTER — Encounter: Payer: Self-pay | Admitting: Radiology

## 2018-05-20 DIAGNOSIS — I509 Heart failure, unspecified: Secondary | ICD-10-CM | POA: Diagnosis not present

## 2018-05-20 DIAGNOSIS — N183 Chronic kidney disease, stage 3 (moderate): Secondary | ICD-10-CM | POA: Diagnosis not present

## 2018-05-20 DIAGNOSIS — T8149XA Infection following a procedure, other surgical site, initial encounter: Secondary | ICD-10-CM | POA: Diagnosis not present

## 2018-05-20 DIAGNOSIS — K810 Acute cholecystitis: Secondary | ICD-10-CM

## 2018-05-20 DIAGNOSIS — I130 Hypertensive heart and chronic kidney disease with heart failure and stage 1 through stage 4 chronic kidney disease, or unspecified chronic kidney disease: Secondary | ICD-10-CM | POA: Diagnosis not present

## 2018-05-20 DIAGNOSIS — E1122 Type 2 diabetes mellitus with diabetic chronic kidney disease: Secondary | ICD-10-CM | POA: Diagnosis not present

## 2018-05-20 HISTORY — PX: IR RADIOLOGIST EVAL & MGMT: IMG5224

## 2018-05-20 MED ORDER — LOSARTAN POTASSIUM 50 MG PO TABS
100.00 | ORAL_TABLET | ORAL | Status: DC
Start: 2018-05-19 — End: 2018-05-20

## 2018-05-20 MED ORDER — PANTOPRAZOLE SODIUM 40 MG PO TBEC
40.00 | DELAYED_RELEASE_TABLET | ORAL | Status: DC
Start: 2018-05-19 — End: 2018-05-20

## 2018-05-20 MED ORDER — ASPIRIN EC 81 MG PO TBEC
81.00 | DELAYED_RELEASE_TABLET | ORAL | Status: DC
Start: 2018-05-19 — End: 2018-05-20

## 2018-05-20 MED ORDER — QUETIAPINE FUMARATE 25 MG PO TABS
25.00 | ORAL_TABLET | ORAL | Status: DC
Start: ? — End: 2018-05-20

## 2018-05-20 MED ORDER — ATORVASTATIN CALCIUM 40 MG PO TABS
40.00 | ORAL_TABLET | ORAL | Status: DC
Start: 2018-05-18 — End: 2018-05-20

## 2018-05-20 MED ORDER — SODIUM CHLORIDE FLUSH 0.9 % IV SOLN
10.00 | INTRAVENOUS | Status: DC
Start: 2018-05-18 — End: 2018-05-20

## 2018-05-20 MED ORDER — GLUCOSE 40 % PO GEL
15.00 | ORAL | Status: DC
Start: ? — End: 2018-05-20

## 2018-05-20 MED ORDER — SODIUM CHLORIDE 0.9 % IV SOLN
INTRAVENOUS | Status: DC
Start: ? — End: 2018-05-20

## 2018-05-20 MED ORDER — INSULIN LISPRO 100 UNIT/ML ~~LOC~~ SOLN
2.00 | SUBCUTANEOUS | Status: DC
Start: 2018-05-18 — End: 2018-05-20

## 2018-05-20 MED ORDER — ACETAMINOPHEN 325 MG PO TABS
650.00 | ORAL_TABLET | ORAL | Status: DC
Start: ? — End: 2018-05-20

## 2018-05-20 MED ORDER — GUAIFENESIN-DM 100-10 MG/5ML PO SYRP
5.00 | ORAL_SOLUTION | ORAL | Status: DC
Start: ? — End: 2018-05-20

## 2018-05-20 MED ORDER — HEPARIN SODIUM (PORCINE) 5000 UNIT/ML IJ SOLN
5000.00 | INTRAMUSCULAR | Status: DC
Start: 2018-05-18 — End: 2018-05-20

## 2018-05-20 MED ORDER — CARVEDILOL 12.5 MG PO TABS
12.50 | ORAL_TABLET | ORAL | Status: DC
Start: 2018-05-18 — End: 2018-05-20

## 2018-05-20 MED ORDER — ONDANSETRON HCL 4 MG/2ML IJ SOLN
4.00 | INTRAMUSCULAR | Status: DC
Start: ? — End: 2018-05-20

## 2018-05-20 MED ORDER — GLUCAGON HCL RDNA (DIAGNOSTIC) 1 MG IJ SOLR
1.00 | INTRAMUSCULAR | Status: DC
Start: ? — End: 2018-05-20

## 2018-05-20 MED ORDER — BISACODYL 5 MG PO TBEC
10.00 | DELAYED_RELEASE_TABLET | ORAL | Status: DC
Start: ? — End: 2018-05-20

## 2018-05-20 MED ORDER — DOCUSATE SODIUM 100 MG PO CAPS
100.00 | ORAL_CAPSULE | ORAL | Status: DC
Start: ? — End: 2018-05-20

## 2018-05-20 MED ORDER — DEXTROSE 10 % IV SOLN
125.00 | INTRAVENOUS | Status: DC
Start: ? — End: 2018-05-20

## 2018-05-20 NOTE — Progress Notes (Signed)
Chief Complaint: Percutaneous Cholecystostomy  Referring Physician(s): Dr. Georgia Lopes  Supervising Physician: Corrie Mckusick  History of Present Illness: Ray Walters is a 85 y.o. male who was admitted to [facility] on with several days of abd pain, N/V.  Pt with underlying hx of dementia as well.  Workup included multiple abdominal imaging studies.  HIDA showed non-visualization of the gallbladder c/w cholecystitis.   He is determined to be a poor surgical candidate due to his age and comorbidities including cardiac disease.  He underwent percutaneous cholecystostomy on 04/22/18 by Dr. Pascal Lux.  I am familiar with this patient and per his chart from [place], he will NEVER be a candidate for cholecystectomy.  He feels ok. He denies any fever/chills, abdominal pain.  Past Medical History:  Diagnosis Date  . 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 Chronic renal insufficiency   . Diabetes mellitus   . Heart murmur   . Hypertension   . Loss of one eye    right eye blasting accident    Past Surgical History:  Procedure Laterality Date  . APPENDECTOMY    . loss left hand    . loss of right eye    . TONSILECTOMY, ADENOIDECTOMY, BILATERAL MYRINGOTOMY AND TUBES      Allergies: Carvedilol  Medications: Prior to Admission medications   Medication Sig Start Date End Date Taking? Authorizing Provider  aspirin 81 MG tablet Take 81 mg by mouth daily.    [provider]  CALCIUM PO Take 1,000 mg by mouth daily.    [provider]  carvedilol (COREG) 12.5 MG tablet Take 1 tablet (12.5 mg total) by mouth 2 (two) times daily with a meal. 09/09/12   Nahser, Wonda Cheng, MD  clopidogrel (PLAVIX) 75 MG tablet Take 75 mg by mouth daily.  06/15/17   [provider]  Co-Enzyme Q-10 30 MG CAPS Take by mouth daily.    [provider]  ELIQUIS 5 MG TABS tablet 2 (two) times daily. 06/21/17   [provider]  furosemide  (LASIX) 20 MG tablet 20 mg daily.  06/14/17   [provider]  losartan-hydrochlorothiazide (HYZAAR) 100-25 MG per tablet Take 1 tablet by mouth daily. 05/25/13   Nahser, Wonda Cheng, MD  metFORMIN (GLUCOPHAGE) 500 MG tablet Take 500 mg by mouth 2 (two) times daily with a meal.    [provider]  NON FORMULARY Mangosteem Daily    [provider]  rosuvastatin (CRESTOR) 10 MG tablet Take 10 mg by mouth daily.    [provider]     Family History  Problem Relation Age of Onset  . Parkinsonism Father     Social History   Socioeconomic History  . Marital status: Married    Spouse name: Not on file  . Number of children: Not on file  . Years of education: Not on file  . Highest education level: Not on file  Occupational History  . Occupation: retired Optometrist  Social Needs  . Financial resource strain: Not on file  . Food insecurity:    Worry: Not on file    Inability: Not on file  . Transportation needs:    Medical: Not on file    Non-medical: Not on file  Tobacco Use  . Smoking status: Former Smoker    Packs/day: 1.00    Years: 20.00    Pack years: 20.00    Last attempt to quit: 03/23/1966  Years since quitting: 52.1  . Smokeless tobacco: Never Used  Substance and Sexual Activity  . Alcohol use: No  . Drug use: No  . Sexual activity: Not on file  Lifestyle  . Physical activity:    Days per week: Not on file    Minutes per session: Not on file  . Stress: Not on file  Relationships  . Social connections:    Talks on phone: Not on file    Gets together: Not on file    Attends religious service: Not on file    Active member of club or organization: Not on file    Attends meetings of clubs or organizations: Not on file    Relationship status: Not on file  Other Topics Concern  . Not on file  Social History Narrative  . Not on file     Review of Systems: A 12 point ROS discussed and pertinent positives are indicated  in the HPI above.  All other systems are negative.  Review of Systems  Physical Exam Awake and alert NAD Chole drain in place,  Intact, looks good Bag has about 300 mL bilious drainage.     Imaging: No results found.  Labs:  CBC: Recent Labs    05/31/17 1248  WBC 6.9  HGB 12.9*  HCT 36.7*  PLT 166    COAGS: No results for input(s): INR, APTT in the last 8760 hours.  BMP: Recent Labs    05/31/17 1248  NA 135  K 3.5  CL 103  CO2 22  GLUCOSE 170*  BUN 33*  CALCIUM 9.4  CREATININE 1.68*  GFRNONAA 35*  GFRAA 41*    LIVER FUNCTION TESTS: Recent Labs    05/31/17 1248  BILITOT 0.6  AST 20  ALT 16*  ALKPHOS 62  PROT 6.4*  ALBUMIN 3.7    TUMOR MARKERS: No results for input(s): AFPTM, CEA, CA199, CHROMGRNA in the last 8760 hours.  Assessment:  Acute cholecystitis in a high risk patient.  Drain injection today showed NO contrast traveling into the duodenum consistent with cystic duct obstruction.  Plan:  Patient is NOT a candidate for cholecystectomy and will need to keep this chole tube in place.  He will need exchange in about 6 weeks.  Will schedule this at [facility] (patient preference).  Electronically Signed: Murrell Redden PA-C 05/20/2018, 1:20 PM    Please refer to Dr. Pasty Arch attestation of this note for management and plan.

## 2018-05-22 DIAGNOSIS — R6889 Other general symptoms and signs: Secondary | ICD-10-CM | POA: Diagnosis not present

## 2018-05-22 DIAGNOSIS — E871 Hypo-osmolality and hyponatremia: Secondary | ICD-10-CM | POA: Diagnosis not present

## 2018-05-22 DIAGNOSIS — E869 Volume depletion, unspecified: Secondary | ICD-10-CM | POA: Diagnosis not present

## 2018-05-22 DIAGNOSIS — R634 Abnormal weight loss: Secondary | ICD-10-CM | POA: Diagnosis not present

## 2018-05-26 IMAGING — US US CAROTID DUPLEX BILAT
1 series · 13 of 24 positions shown · non-contrast
Comparison: None.

CLINICAL DATA: Visual disturbance

EXAM:
BILATERAL CAROTID DUPLEX ULTRASOUND
TECHNIQUE: Gray scale imaging, color Doppler and duplex ultrasound were
performed of bilateral carotid and vertebral arteries in the neck.

[Series 1: us carotid duplex bilat · 0.06mm/px · 13 of 47 slices shown]
[im 1/47]
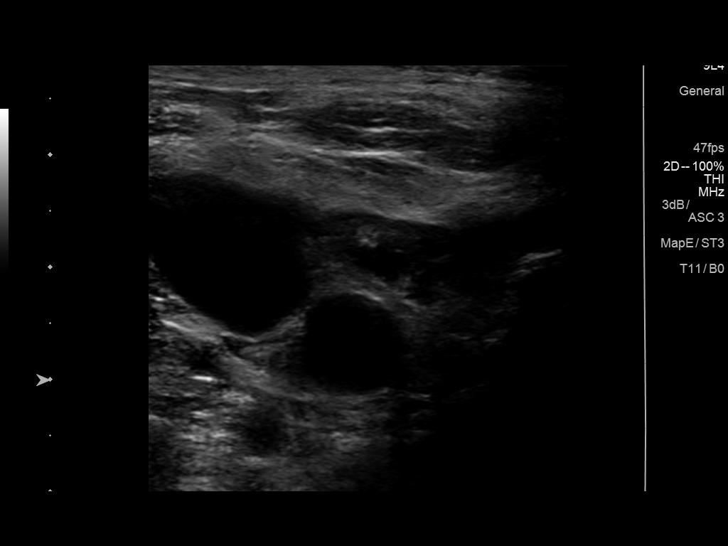
[im 5/47]
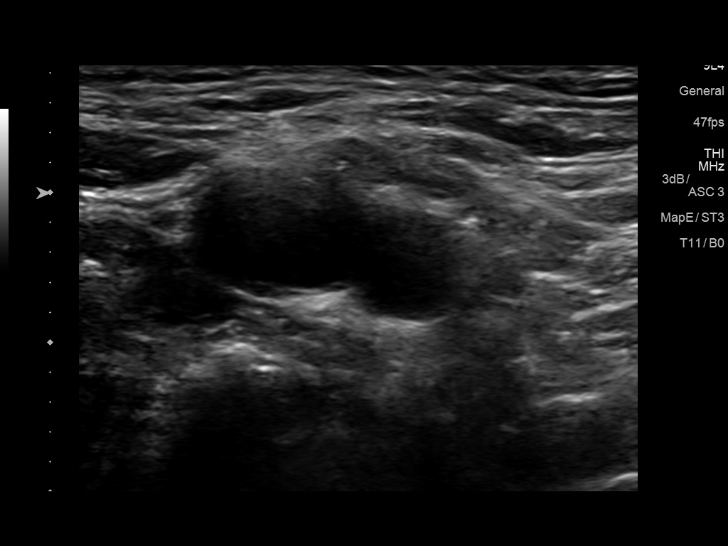
[im 9/47]
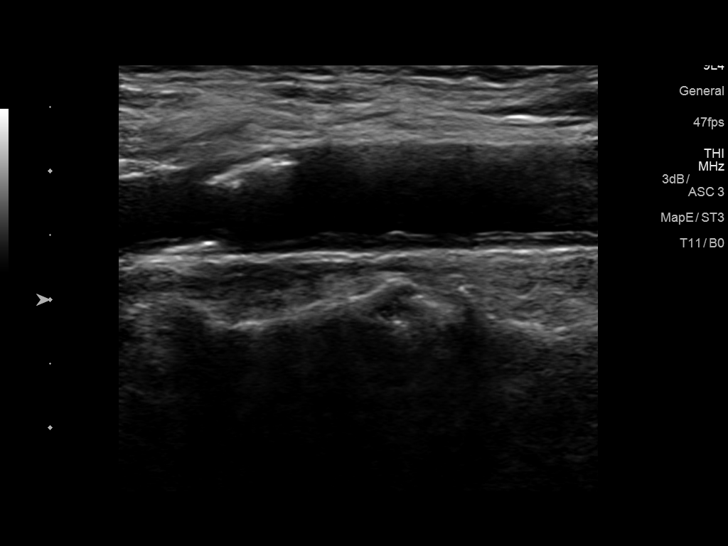
[im 13/47]
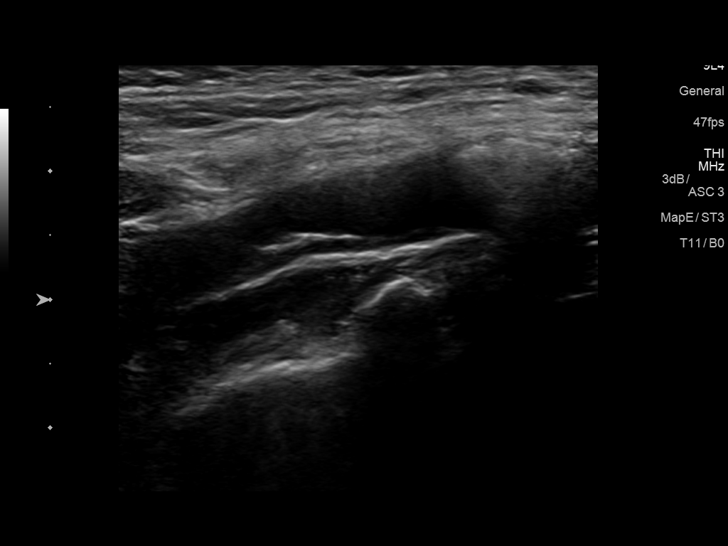
[im 17/47]
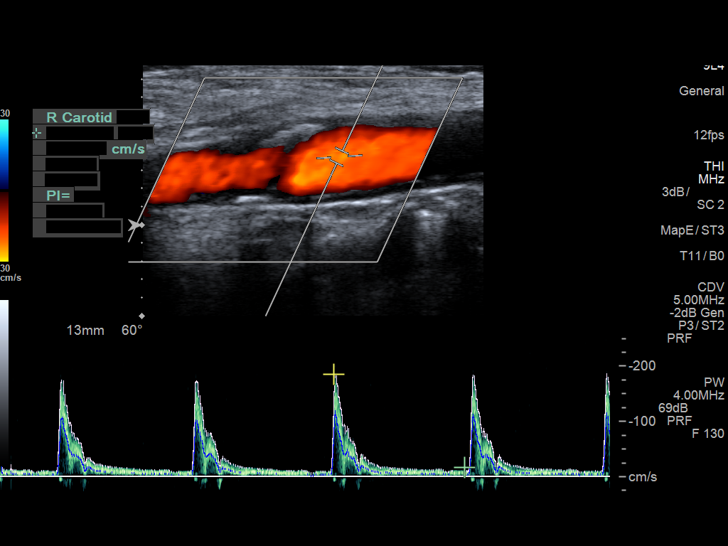
[im 21/47]
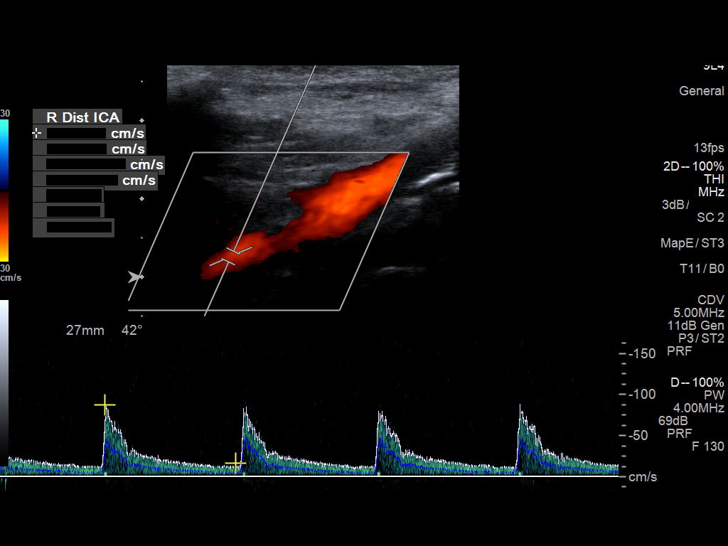
[im 25/47]
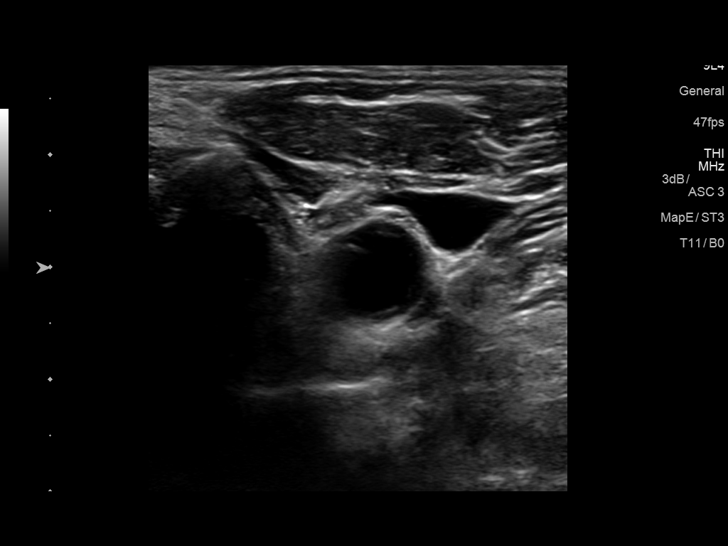
[im 27/47]
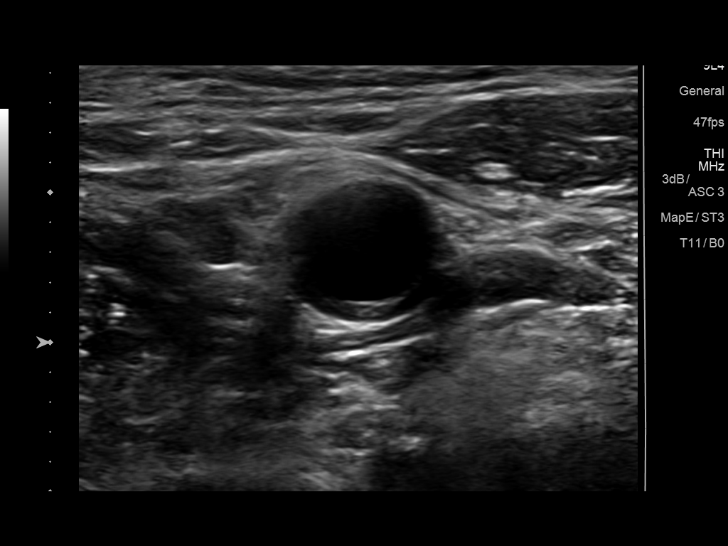
[im 31/47]
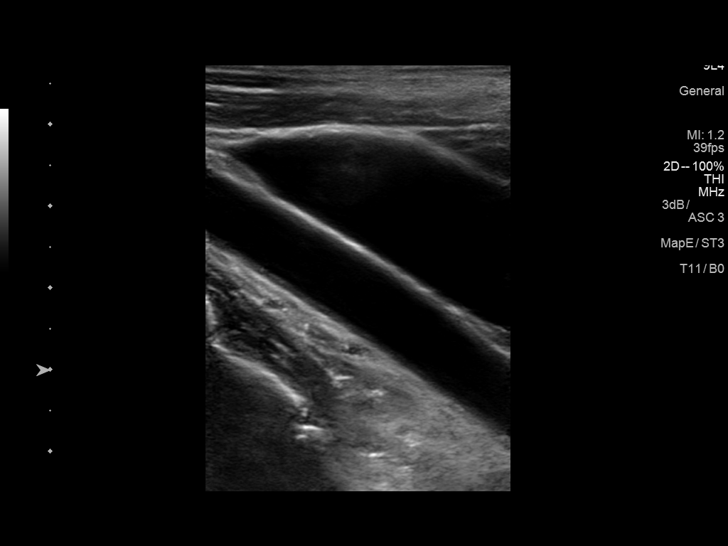
[im 35/47]
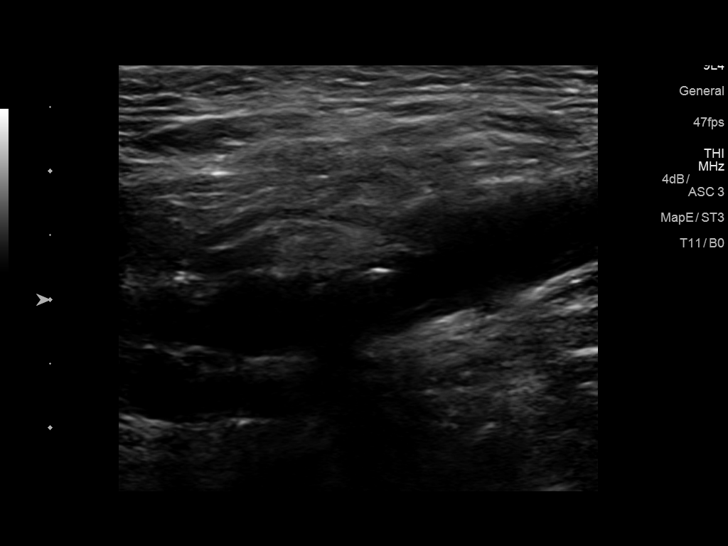
[im 39/47]
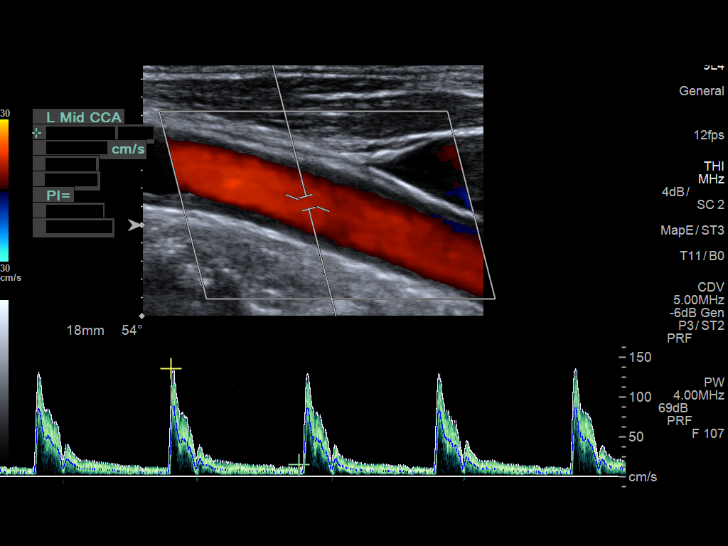
[im 43/47]
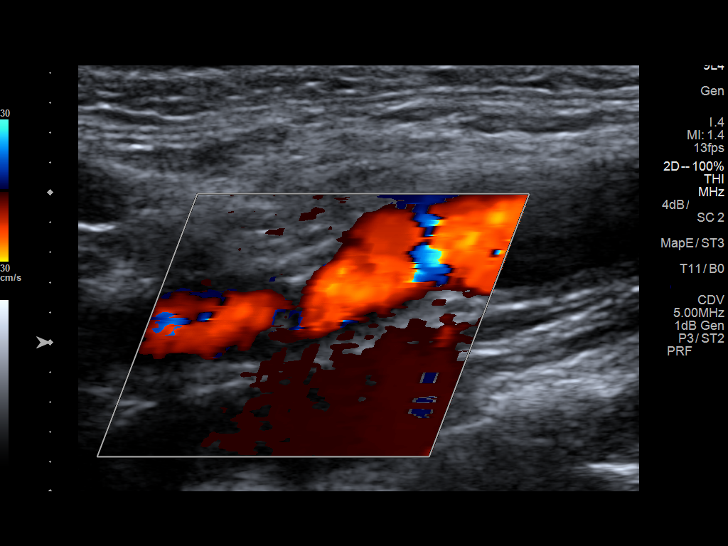
[im 47/47]
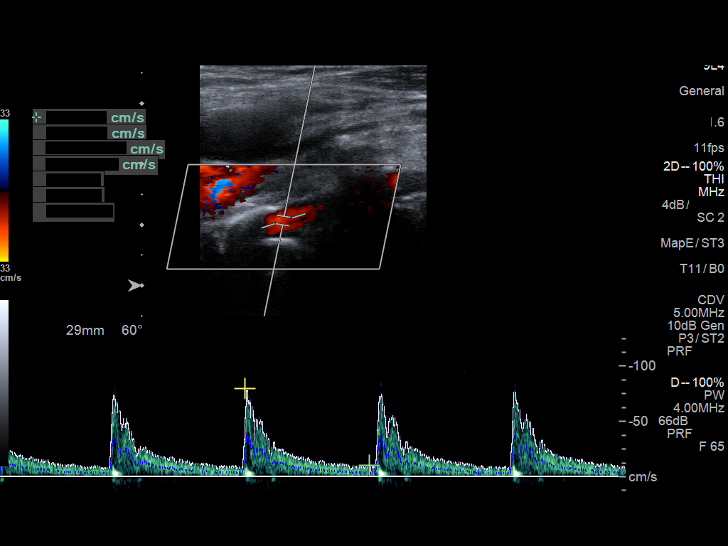

[13 of 24 positions shown; findings below may reference images not displayed]

FINDINGS: Criteria: Quantification of carotid stenosis is based on velocity
parameters that correlate the residual internal carotid diameter
with NASCET-based stenosis levels, using the diameter of the distal
internal carotid lumen as the denominator for stenosis measurement.

The following velocity measurements were obtained:

RIGHT

ICA:  126 cm/sec

CCA:  203 cm/sec

SYSTOLIC ICA/CCA RATIO:

DIASTOLIC ICA/CCA RATIO:

ECA:  210 cm/sec

LEFT

ICA:  113 cm/sec

CCA:  212 cm/sec

SYSTOLIC ICA/CCA RATIO:

DIASTOLIC ICA/CCA RATIO:

ECA:  195 cm/sec

RIGHT CAROTID ARTERY: Moderate calcified plaque in the bulb. Low
resistance internal carotid Doppler pattern. Color Doppler imaging
demonstrates relative wide patency through the bulb.

RIGHT VERTEBRAL ARTERY:  Antegrade.

LEFT CAROTID ARTERY: Moderate calcified plaque in the bulb. Low
resistance internal carotid Doppler pattern is preserved.

LEFT VERTEBRAL ARTERY:  Antegrade.
IMPRESSION: Less than 50% stenosis in the right and left internal carotid
arteries.

## 2018-05-28 DIAGNOSIS — R195 Other fecal abnormalities: Secondary | ICD-10-CM | POA: Diagnosis not present

## 2018-05-28 DIAGNOSIS — K819 Cholecystitis, unspecified: Secondary | ICD-10-CM | POA: Diagnosis not present

## 2018-06-02 DIAGNOSIS — C44329 Squamous cell carcinoma of skin of other parts of face: Secondary | ICD-10-CM | POA: Diagnosis not present

## 2018-06-02 DIAGNOSIS — C44229 Squamous cell carcinoma of skin of left ear and external auricular canal: Secondary | ICD-10-CM | POA: Diagnosis not present

## 2018-06-02 DIAGNOSIS — D485 Neoplasm of uncertain behavior of skin: Secondary | ICD-10-CM | POA: Diagnosis not present

## 2018-06-04 DIAGNOSIS — N3289 Other specified disorders of bladder: Secondary | ICD-10-CM | POA: Diagnosis not present

## 2018-06-04 DIAGNOSIS — M4856XA Collapsed vertebra, not elsewhere classified, lumbar region, initial encounter for fracture: Secondary | ICD-10-CM | POA: Diagnosis not present

## 2018-06-04 DIAGNOSIS — I700 Atherosclerosis of aorta: Secondary | ICD-10-CM | POA: Diagnosis not present

## 2018-06-04 DIAGNOSIS — N400 Enlarged prostate without lower urinary tract symptoms: Secondary | ICD-10-CM | POA: Diagnosis not present

## 2018-06-04 DIAGNOSIS — Z9359 Other cystostomy status: Secondary | ICD-10-CM | POA: Diagnosis not present

## 2018-06-04 DIAGNOSIS — J90 Pleural effusion, not elsewhere classified: Secondary | ICD-10-CM | POA: Diagnosis not present

## 2018-06-04 DIAGNOSIS — J9811 Atelectasis: Secondary | ICD-10-CM | POA: Diagnosis not present

## 2018-06-04 DIAGNOSIS — K8012 Calculus of gallbladder with acute and chronic cholecystitis without obstruction: Secondary | ICD-10-CM | POA: Diagnosis not present

## 2018-06-04 DIAGNOSIS — K811 Chronic cholecystitis: Secondary | ICD-10-CM | POA: Diagnosis not present

## 2018-06-04 DIAGNOSIS — N329 Bladder disorder, unspecified: Secondary | ICD-10-CM | POA: Diagnosis not present

## 2018-06-04 DIAGNOSIS — R1011 Right upper quadrant pain: Secondary | ICD-10-CM | POA: Diagnosis not present

## 2018-06-04 DIAGNOSIS — K5730 Diverticulosis of large intestine without perforation or abscess without bleeding: Secondary | ICD-10-CM | POA: Diagnosis not present

## 2018-06-04 DIAGNOSIS — K5790 Diverticulosis of intestine, part unspecified, without perforation or abscess without bleeding: Secondary | ICD-10-CM | POA: Diagnosis not present

## 2018-06-09 ENCOUNTER — Other Ambulatory Visit: Payer: Medicare HMO

## 2018-06-11 DIAGNOSIS — K810 Acute cholecystitis: Secondary | ICD-10-CM | POA: Diagnosis not present

## 2018-06-11 DIAGNOSIS — D649 Anemia, unspecified: Secondary | ICD-10-CM | POA: Diagnosis not present

## 2018-06-11 DIAGNOSIS — Z434 Encounter for attention to other artificial openings of digestive tract: Secondary | ICD-10-CM | POA: Diagnosis not present

## 2018-06-17 DIAGNOSIS — K8012 Calculus of gallbladder with acute and chronic cholecystitis without obstruction: Secondary | ICD-10-CM | POA: Diagnosis not present

## 2018-06-17 DIAGNOSIS — K819 Cholecystitis, unspecified: Secondary | ICD-10-CM | POA: Diagnosis not present

## 2018-06-17 DIAGNOSIS — Z950 Presence of cardiac pacemaker: Secondary | ICD-10-CM | POA: Diagnosis not present

## 2018-06-18 DIAGNOSIS — I509 Heart failure, unspecified: Secondary | ICD-10-CM | POA: Diagnosis not present

## 2018-06-18 DIAGNOSIS — I130 Hypertensive heart and chronic kidney disease with heart failure and stage 1 through stage 4 chronic kidney disease, or unspecified chronic kidney disease: Secondary | ICD-10-CM | POA: Diagnosis not present

## 2018-06-18 DIAGNOSIS — N183 Chronic kidney disease, stage 3 (moderate): Secondary | ICD-10-CM | POA: Diagnosis not present

## 2018-06-18 DIAGNOSIS — E1122 Type 2 diabetes mellitus with diabetic chronic kidney disease: Secondary | ICD-10-CM | POA: Diagnosis not present

## 2018-06-18 DIAGNOSIS — T8149XA Infection following a procedure, other surgical site, initial encounter: Secondary | ICD-10-CM | POA: Diagnosis not present

## 2018-06-22 DIAGNOSIS — K811 Chronic cholecystitis: Secondary | ICD-10-CM | POA: Diagnosis not present

## 2018-06-22 DIAGNOSIS — Z434 Encounter for attention to other artificial openings of digestive tract: Secondary | ICD-10-CM | POA: Diagnosis not present

## 2018-06-23 DIAGNOSIS — I10 Essential (primary) hypertension: Secondary | ICD-10-CM | POA: Diagnosis not present

## 2018-06-23 DIAGNOSIS — E559 Vitamin D deficiency, unspecified: Secondary | ICD-10-CM | POA: Diagnosis not present

## 2018-06-23 DIAGNOSIS — E119 Type 2 diabetes mellitus without complications: Secondary | ICD-10-CM | POA: Diagnosis not present

## 2018-06-23 DIAGNOSIS — E782 Mixed hyperlipidemia: Secondary | ICD-10-CM | POA: Diagnosis not present

## 2018-06-30 DIAGNOSIS — M545 Low back pain: Secondary | ICD-10-CM | POA: Diagnosis not present

## 2018-06-30 DIAGNOSIS — E119 Type 2 diabetes mellitus without complications: Secondary | ICD-10-CM | POA: Diagnosis not present

## 2018-06-30 DIAGNOSIS — R262 Difficulty in walking, not elsewhere classified: Secondary | ICD-10-CM | POA: Diagnosis not present

## 2018-06-30 DIAGNOSIS — M6281 Muscle weakness (generalized): Secondary | ICD-10-CM | POA: Diagnosis not present

## 2018-07-02 DIAGNOSIS — C44229 Squamous cell carcinoma of skin of left ear and external auricular canal: Secondary | ICD-10-CM | POA: Diagnosis not present

## 2018-07-07 DIAGNOSIS — R262 Difficulty in walking, not elsewhere classified: Secondary | ICD-10-CM | POA: Diagnosis not present

## 2018-07-07 DIAGNOSIS — M545 Low back pain: Secondary | ICD-10-CM | POA: Diagnosis not present

## 2018-07-07 DIAGNOSIS — R69 Illness, unspecified: Secondary | ICD-10-CM | POA: Diagnosis not present

## 2018-07-07 DIAGNOSIS — M6281 Muscle weakness (generalized): Secondary | ICD-10-CM | POA: Diagnosis not present

## 2018-07-08 DIAGNOSIS — D0439 Carcinoma in situ of skin of other parts of face: Secondary | ICD-10-CM | POA: Diagnosis not present

## 2018-07-08 DIAGNOSIS — C44329 Squamous cell carcinoma of skin of other parts of face: Secondary | ICD-10-CM | POA: Diagnosis not present

## 2018-07-08 DIAGNOSIS — L821 Other seborrheic keratosis: Secondary | ICD-10-CM | POA: Diagnosis not present

## 2018-07-08 DIAGNOSIS — D485 Neoplasm of uncertain behavior of skin: Secondary | ICD-10-CM | POA: Diagnosis not present

## 2018-07-08 DIAGNOSIS — L570 Actinic keratosis: Secondary | ICD-10-CM | POA: Diagnosis not present

## 2018-07-08 DIAGNOSIS — C44222 Squamous cell carcinoma of skin of right ear and external auricular canal: Secondary | ICD-10-CM | POA: Diagnosis not present

## 2018-07-10 DIAGNOSIS — M545 Low back pain: Secondary | ICD-10-CM | POA: Diagnosis not present

## 2018-07-10 DIAGNOSIS — R262 Difficulty in walking, not elsewhere classified: Secondary | ICD-10-CM | POA: Diagnosis not present

## 2018-07-10 DIAGNOSIS — M6281 Muscle weakness (generalized): Secondary | ICD-10-CM | POA: Diagnosis not present

## 2018-07-14 DIAGNOSIS — C44329 Squamous cell carcinoma of skin of other parts of face: Secondary | ICD-10-CM | POA: Diagnosis not present

## 2018-07-17 DIAGNOSIS — M6281 Muscle weakness (generalized): Secondary | ICD-10-CM | POA: Diagnosis not present

## 2018-07-17 DIAGNOSIS — M545 Low back pain: Secondary | ICD-10-CM | POA: Diagnosis not present

## 2018-07-17 DIAGNOSIS — R262 Difficulty in walking, not elsewhere classified: Secondary | ICD-10-CM | POA: Diagnosis not present

## 2018-07-21 DIAGNOSIS — M545 Low back pain: Secondary | ICD-10-CM | POA: Diagnosis not present

## 2018-07-21 DIAGNOSIS — R262 Difficulty in walking, not elsewhere classified: Secondary | ICD-10-CM | POA: Diagnosis not present

## 2018-07-21 DIAGNOSIS — M6281 Muscle weakness (generalized): Secondary | ICD-10-CM | POA: Diagnosis not present

## 2018-07-24 DIAGNOSIS — M545 Low back pain: Secondary | ICD-10-CM | POA: Diagnosis not present

## 2018-07-24 DIAGNOSIS — I498 Other specified cardiac arrhythmias: Secondary | ICD-10-CM | POA: Diagnosis not present

## 2018-07-24 DIAGNOSIS — R262 Difficulty in walking, not elsewhere classified: Secondary | ICD-10-CM | POA: Diagnosis not present

## 2018-07-24 DIAGNOSIS — I129 Hypertensive chronic kidney disease with stage 1 through stage 4 chronic kidney disease, or unspecified chronic kidney disease: Secondary | ICD-10-CM | POA: Diagnosis not present

## 2018-07-24 DIAGNOSIS — R5381 Other malaise: Secondary | ICD-10-CM | POA: Diagnosis not present

## 2018-07-24 DIAGNOSIS — M6281 Muscle weakness (generalized): Secondary | ICD-10-CM | POA: Diagnosis not present

## 2018-07-24 DIAGNOSIS — N183 Chronic kidney disease, stage 3 (moderate): Secondary | ICD-10-CM | POA: Diagnosis not present

## 2018-07-24 DIAGNOSIS — Z950 Presence of cardiac pacemaker: Secondary | ICD-10-CM | POA: Diagnosis not present

## 2018-07-24 DIAGNOSIS — I459 Conduction disorder, unspecified: Secondary | ICD-10-CM | POA: Diagnosis not present

## 2018-07-24 DIAGNOSIS — E785 Hyperlipidemia, unspecified: Secondary | ICD-10-CM | POA: Diagnosis not present

## 2018-07-24 DIAGNOSIS — E1165 Type 2 diabetes mellitus with hyperglycemia: Secondary | ICD-10-CM | POA: Diagnosis not present

## 2018-07-24 DIAGNOSIS — N179 Acute kidney failure, unspecified: Secondary | ICD-10-CM | POA: Diagnosis not present

## 2018-07-24 DIAGNOSIS — R001 Bradycardia, unspecified: Secondary | ICD-10-CM | POA: Diagnosis not present

## 2018-07-27 DIAGNOSIS — K811 Chronic cholecystitis: Secondary | ICD-10-CM | POA: Diagnosis not present

## 2018-07-27 DIAGNOSIS — Z4659 Encounter for fitting and adjustment of other gastrointestinal appliance and device: Secondary | ICD-10-CM | POA: Diagnosis not present

## 2018-07-28 DIAGNOSIS — R262 Difficulty in walking, not elsewhere classified: Secondary | ICD-10-CM | POA: Diagnosis not present

## 2018-07-28 DIAGNOSIS — M6281 Muscle weakness (generalized): Secondary | ICD-10-CM | POA: Diagnosis not present

## 2018-07-28 DIAGNOSIS — M545 Low back pain: Secondary | ICD-10-CM | POA: Diagnosis not present

## 2018-07-29 DIAGNOSIS — E7210 Disorders of sulfur-bearing amino-acid metabolism, unspecified: Secondary | ICD-10-CM | POA: Diagnosis not present

## 2018-07-29 DIAGNOSIS — E119 Type 2 diabetes mellitus without complications: Secondary | ICD-10-CM | POA: Diagnosis not present

## 2018-07-29 DIAGNOSIS — D0439 Carcinoma in situ of skin of other parts of face: Secondary | ICD-10-CM | POA: Diagnosis not present

## 2018-07-29 DIAGNOSIS — R7989 Other specified abnormal findings of blood chemistry: Secondary | ICD-10-CM | POA: Diagnosis not present

## 2018-07-29 DIAGNOSIS — E559 Vitamin D deficiency, unspecified: Secondary | ICD-10-CM | POA: Diagnosis not present

## 2018-07-31 DIAGNOSIS — M545 Low back pain: Secondary | ICD-10-CM | POA: Diagnosis not present

## 2018-07-31 DIAGNOSIS — R262 Difficulty in walking, not elsewhere classified: Secondary | ICD-10-CM | POA: Diagnosis not present

## 2018-07-31 DIAGNOSIS — M6281 Muscle weakness (generalized): Secondary | ICD-10-CM | POA: Diagnosis not present

## 2018-08-04 DIAGNOSIS — I129 Hypertensive chronic kidney disease with stage 1 through stage 4 chronic kidney disease, or unspecified chronic kidney disease: Secondary | ICD-10-CM | POA: Diagnosis not present

## 2018-08-04 DIAGNOSIS — I10 Essential (primary) hypertension: Secondary | ICD-10-CM | POA: Diagnosis not present

## 2018-08-04 DIAGNOSIS — I5041 Acute combined systolic (congestive) and diastolic (congestive) heart failure: Secondary | ICD-10-CM | POA: Diagnosis not present

## 2018-08-04 DIAGNOSIS — I2510 Atherosclerotic heart disease of native coronary artery without angina pectoris: Secondary | ICD-10-CM | POA: Diagnosis not present

## 2018-08-04 DIAGNOSIS — E785 Hyperlipidemia, unspecified: Secondary | ICD-10-CM | POA: Diagnosis not present

## 2018-08-04 DIAGNOSIS — E1169 Type 2 diabetes mellitus with other specified complication: Secondary | ICD-10-CM | POA: Diagnosis not present

## 2018-08-04 DIAGNOSIS — N183 Chronic kidney disease, stage 3 (moderate): Secondary | ICD-10-CM | POA: Diagnosis not present

## 2018-08-04 DIAGNOSIS — D649 Anemia, unspecified: Secondary | ICD-10-CM | POA: Diagnosis not present

## 2018-08-05 DIAGNOSIS — M6281 Muscle weakness (generalized): Secondary | ICD-10-CM | POA: Diagnosis not present

## 2018-08-05 DIAGNOSIS — M545 Low back pain: Secondary | ICD-10-CM | POA: Diagnosis not present

## 2018-08-05 DIAGNOSIS — R262 Difficulty in walking, not elsewhere classified: Secondary | ICD-10-CM | POA: Diagnosis not present

## 2018-08-06 DIAGNOSIS — E119 Type 2 diabetes mellitus without complications: Secondary | ICD-10-CM | POA: Diagnosis not present

## 2018-08-13 DIAGNOSIS — C44222 Squamous cell carcinoma of skin of right ear and external auricular canal: Secondary | ICD-10-CM | POA: Diagnosis not present

## 2018-08-14 DIAGNOSIS — R69 Illness, unspecified: Secondary | ICD-10-CM | POA: Diagnosis not present

## 2018-08-18 DIAGNOSIS — M545 Low back pain: Secondary | ICD-10-CM | POA: Diagnosis not present

## 2018-08-18 DIAGNOSIS — R262 Difficulty in walking, not elsewhere classified: Secondary | ICD-10-CM | POA: Diagnosis not present

## 2018-08-18 DIAGNOSIS — M6281 Muscle weakness (generalized): Secondary | ICD-10-CM | POA: Diagnosis not present

## 2018-08-20 DIAGNOSIS — M6281 Muscle weakness (generalized): Secondary | ICD-10-CM | POA: Diagnosis not present

## 2018-08-20 DIAGNOSIS — R262 Difficulty in walking, not elsewhere classified: Secondary | ICD-10-CM | POA: Diagnosis not present

## 2018-08-20 DIAGNOSIS — M545 Low back pain: Secondary | ICD-10-CM | POA: Diagnosis not present

## 2018-08-25 DIAGNOSIS — R262 Difficulty in walking, not elsewhere classified: Secondary | ICD-10-CM | POA: Diagnosis not present

## 2018-08-25 DIAGNOSIS — M6281 Muscle weakness (generalized): Secondary | ICD-10-CM | POA: Diagnosis not present

## 2018-08-25 DIAGNOSIS — M545 Low back pain: Secondary | ICD-10-CM | POA: Diagnosis not present

## 2018-08-26 DIAGNOSIS — T85628A Displacement of other specified internal prosthetic devices, implants and grafts, initial encounter: Secondary | ICD-10-CM | POA: Diagnosis not present

## 2018-08-26 DIAGNOSIS — T85590A Other mechanical complication of bile duct prosthesis, initial encounter: Secondary | ICD-10-CM | POA: Diagnosis not present

## 2018-08-26 DIAGNOSIS — K811 Chronic cholecystitis: Secondary | ICD-10-CM | POA: Diagnosis not present

## 2018-08-26 DIAGNOSIS — Y833 Surgical operation with formation of external stoma as the cause of abnormal reaction of the patient, or of later complication, without mention of misadventure at the time of the procedure: Secondary | ICD-10-CM | POA: Diagnosis not present

## 2018-08-27 DIAGNOSIS — D225 Melanocytic nevi of trunk: Secondary | ICD-10-CM | POA: Diagnosis not present

## 2018-08-27 DIAGNOSIS — Z85828 Personal history of other malignant neoplasm of skin: Secondary | ICD-10-CM | POA: Diagnosis not present

## 2018-08-27 DIAGNOSIS — L570 Actinic keratosis: Secondary | ICD-10-CM | POA: Diagnosis not present

## 2018-08-27 DIAGNOSIS — M6740 Ganglion, unspecified site: Secondary | ICD-10-CM | POA: Diagnosis not present

## 2018-08-27 DIAGNOSIS — L821 Other seborrheic keratosis: Secondary | ICD-10-CM | POA: Diagnosis not present

## 2018-08-28 DIAGNOSIS — R262 Difficulty in walking, not elsewhere classified: Secondary | ICD-10-CM | POA: Diagnosis not present

## 2018-08-28 DIAGNOSIS — M6281 Muscle weakness (generalized): Secondary | ICD-10-CM | POA: Diagnosis not present

## 2018-08-28 DIAGNOSIS — M545 Low back pain: Secondary | ICD-10-CM | POA: Diagnosis not present

## 2018-09-01 DIAGNOSIS — N183 Chronic kidney disease, stage 3 (moderate): Secondary | ICD-10-CM | POA: Diagnosis not present

## 2018-09-01 DIAGNOSIS — E441 Mild protein-calorie malnutrition: Secondary | ICD-10-CM | POA: Diagnosis not present

## 2018-09-01 DIAGNOSIS — M6281 Muscle weakness (generalized): Secondary | ICD-10-CM | POA: Diagnosis not present

## 2018-09-01 DIAGNOSIS — I700 Atherosclerosis of aorta: Secondary | ICD-10-CM | POA: Diagnosis not present

## 2018-09-01 DIAGNOSIS — R262 Difficulty in walking, not elsewhere classified: Secondary | ICD-10-CM | POA: Diagnosis not present

## 2018-09-01 DIAGNOSIS — E1169 Type 2 diabetes mellitus with other specified complication: Secondary | ICD-10-CM | POA: Diagnosis not present

## 2018-09-01 DIAGNOSIS — M545 Low back pain: Secondary | ICD-10-CM | POA: Diagnosis not present

## 2018-09-01 DIAGNOSIS — I5041 Acute combined systolic (congestive) and diastolic (congestive) heart failure: Secondary | ICD-10-CM | POA: Diagnosis not present

## 2018-09-03 DIAGNOSIS — M545 Low back pain: Secondary | ICD-10-CM | POA: Diagnosis not present

## 2018-09-03 DIAGNOSIS — R262 Difficulty in walking, not elsewhere classified: Secondary | ICD-10-CM | POA: Diagnosis not present

## 2018-09-03 DIAGNOSIS — M6281 Muscle weakness (generalized): Secondary | ICD-10-CM | POA: Diagnosis not present

## 2018-09-08 DIAGNOSIS — R262 Difficulty in walking, not elsewhere classified: Secondary | ICD-10-CM | POA: Diagnosis not present

## 2018-09-08 DIAGNOSIS — M545 Low back pain: Secondary | ICD-10-CM | POA: Diagnosis not present

## 2018-09-08 DIAGNOSIS — M6281 Muscle weakness (generalized): Secondary | ICD-10-CM | POA: Diagnosis not present

## 2018-09-22 DIAGNOSIS — R7989 Other specified abnormal findings of blood chemistry: Secondary | ICD-10-CM | POA: Diagnosis not present

## 2018-09-22 DIAGNOSIS — E119 Type 2 diabetes mellitus without complications: Secondary | ICD-10-CM | POA: Diagnosis not present

## 2018-09-22 DIAGNOSIS — E559 Vitamin D deficiency, unspecified: Secondary | ICD-10-CM | POA: Diagnosis not present

## 2018-09-22 DIAGNOSIS — E7210 Disorders of sulfur-bearing amino-acid metabolism, unspecified: Secondary | ICD-10-CM | POA: Diagnosis not present

## 2018-09-22 DIAGNOSIS — I7090 Unspecified atherosclerosis: Secondary | ICD-10-CM | POA: Diagnosis not present

## 2018-09-22 DIAGNOSIS — R5383 Other fatigue: Secondary | ICD-10-CM | POA: Diagnosis not present

## 2018-09-29 DIAGNOSIS — E119 Type 2 diabetes mellitus without complications: Secondary | ICD-10-CM | POA: Diagnosis not present

## 2018-10-06 DIAGNOSIS — D649 Anemia, unspecified: Secondary | ICD-10-CM | POA: Diagnosis not present

## 2018-10-06 DIAGNOSIS — I2510 Atherosclerotic heart disease of native coronary artery without angina pectoris: Secondary | ICD-10-CM | POA: Diagnosis not present

## 2018-10-06 DIAGNOSIS — I10 Essential (primary) hypertension: Secondary | ICD-10-CM | POA: Diagnosis not present

## 2018-10-06 DIAGNOSIS — E1169 Type 2 diabetes mellitus with other specified complication: Secondary | ICD-10-CM | POA: Diagnosis not present

## 2018-10-06 DIAGNOSIS — I129 Hypertensive chronic kidney disease with stage 1 through stage 4 chronic kidney disease, or unspecified chronic kidney disease: Secondary | ICD-10-CM | POA: Diagnosis not present

## 2018-10-06 DIAGNOSIS — E785 Hyperlipidemia, unspecified: Secondary | ICD-10-CM | POA: Diagnosis not present

## 2018-10-06 DIAGNOSIS — I5041 Acute combined systolic (congestive) and diastolic (congestive) heart failure: Secondary | ICD-10-CM | POA: Diagnosis not present

## 2018-10-06 DIAGNOSIS — N183 Chronic kidney disease, stage 3 (moderate): Secondary | ICD-10-CM | POA: Diagnosis not present

## 2018-10-06 DIAGNOSIS — Z7984 Long term (current) use of oral hypoglycemic drugs: Secondary | ICD-10-CM | POA: Diagnosis not present

## 2018-10-08 DIAGNOSIS — Z7984 Long term (current) use of oral hypoglycemic drugs: Secondary | ICD-10-CM | POA: Diagnosis not present

## 2018-10-08 DIAGNOSIS — Z7982 Long term (current) use of aspirin: Secondary | ICD-10-CM | POA: Diagnosis not present

## 2018-10-08 DIAGNOSIS — K219 Gastro-esophageal reflux disease without esophagitis: Secondary | ICD-10-CM | POA: Diagnosis not present

## 2018-10-08 DIAGNOSIS — I2510 Atherosclerotic heart disease of native coronary artery without angina pectoris: Secondary | ICD-10-CM | POA: Diagnosis not present

## 2018-10-08 DIAGNOSIS — I10 Essential (primary) hypertension: Secondary | ICD-10-CM | POA: Diagnosis not present

## 2018-10-08 DIAGNOSIS — E785 Hyperlipidemia, unspecified: Secondary | ICD-10-CM | POA: Diagnosis not present

## 2018-10-08 DIAGNOSIS — G3184 Mild cognitive impairment, so stated: Secondary | ICD-10-CM | POA: Diagnosis not present

## 2018-10-08 DIAGNOSIS — R69 Illness, unspecified: Secondary | ICD-10-CM | POA: Diagnosis not present

## 2018-10-08 DIAGNOSIS — E119 Type 2 diabetes mellitus without complications: Secondary | ICD-10-CM | POA: Diagnosis not present

## 2018-10-08 DIAGNOSIS — H547 Unspecified visual loss: Secondary | ICD-10-CM | POA: Diagnosis not present

## 2018-10-27 DIAGNOSIS — C4442 Squamous cell carcinoma of skin of scalp and neck: Secondary | ICD-10-CM | POA: Diagnosis not present

## 2018-10-27 DIAGNOSIS — L570 Actinic keratosis: Secondary | ICD-10-CM | POA: Diagnosis not present

## 2018-10-27 DIAGNOSIS — D485 Neoplasm of uncertain behavior of skin: Secondary | ICD-10-CM | POA: Diagnosis not present

## 2018-10-27 DIAGNOSIS — M67449 Ganglion, unspecified hand: Secondary | ICD-10-CM | POA: Diagnosis not present

## 2018-11-02 DIAGNOSIS — I10 Essential (primary) hypertension: Secondary | ICD-10-CM | POA: Diagnosis not present

## 2018-11-02 DIAGNOSIS — I495 Sick sinus syndrome: Secondary | ICD-10-CM | POA: Diagnosis not present

## 2018-11-02 DIAGNOSIS — E1169 Type 2 diabetes mellitus with other specified complication: Secondary | ICD-10-CM | POA: Diagnosis not present

## 2018-11-02 DIAGNOSIS — N183 Chronic kidney disease, stage 3 (moderate): Secondary | ICD-10-CM | POA: Diagnosis not present

## 2018-11-02 DIAGNOSIS — I2510 Atherosclerotic heart disease of native coronary artery without angina pectoris: Secondary | ICD-10-CM | POA: Diagnosis not present

## 2018-11-02 DIAGNOSIS — Z23 Encounter for immunization: Secondary | ICD-10-CM | POA: Diagnosis not present

## 2018-11-05 DIAGNOSIS — E785 Hyperlipidemia, unspecified: Secondary | ICD-10-CM | POA: Diagnosis not present

## 2018-11-05 DIAGNOSIS — I5041 Acute combined systolic (congestive) and diastolic (congestive) heart failure: Secondary | ICD-10-CM | POA: Diagnosis not present

## 2018-11-05 DIAGNOSIS — N183 Chronic kidney disease, stage 3 (moderate): Secondary | ICD-10-CM | POA: Diagnosis not present

## 2018-11-05 DIAGNOSIS — I129 Hypertensive chronic kidney disease with stage 1 through stage 4 chronic kidney disease, or unspecified chronic kidney disease: Secondary | ICD-10-CM | POA: Diagnosis not present

## 2018-11-05 DIAGNOSIS — D649 Anemia, unspecified: Secondary | ICD-10-CM | POA: Diagnosis not present

## 2018-11-05 DIAGNOSIS — E1169 Type 2 diabetes mellitus with other specified complication: Secondary | ICD-10-CM | POA: Diagnosis not present

## 2018-11-05 DIAGNOSIS — I2510 Atherosclerotic heart disease of native coronary artery without angina pectoris: Secondary | ICD-10-CM | POA: Diagnosis not present

## 2018-11-05 DIAGNOSIS — I10 Essential (primary) hypertension: Secondary | ICD-10-CM | POA: Diagnosis not present

## 2018-11-17 DIAGNOSIS — Z950 Presence of cardiac pacemaker: Secondary | ICD-10-CM | POA: Diagnosis not present

## 2018-12-01 DIAGNOSIS — R69 Illness, unspecified: Secondary | ICD-10-CM | POA: Diagnosis not present

## 2018-12-04 DIAGNOSIS — I129 Hypertensive chronic kidney disease with stage 1 through stage 4 chronic kidney disease, or unspecified chronic kidney disease: Secondary | ICD-10-CM | POA: Diagnosis not present

## 2018-12-04 DIAGNOSIS — I10 Essential (primary) hypertension: Secondary | ICD-10-CM | POA: Diagnosis not present

## 2018-12-04 DIAGNOSIS — I2510 Atherosclerotic heart disease of native coronary artery without angina pectoris: Secondary | ICD-10-CM | POA: Diagnosis not present

## 2018-12-04 DIAGNOSIS — E1169 Type 2 diabetes mellitus with other specified complication: Secondary | ICD-10-CM | POA: Diagnosis not present

## 2018-12-04 DIAGNOSIS — N183 Chronic kidney disease, stage 3 (moderate): Secondary | ICD-10-CM | POA: Diagnosis not present

## 2018-12-04 DIAGNOSIS — D649 Anemia, unspecified: Secondary | ICD-10-CM | POA: Diagnosis not present

## 2018-12-04 DIAGNOSIS — I5041 Acute combined systolic (congestive) and diastolic (congestive) heart failure: Secondary | ICD-10-CM | POA: Diagnosis not present

## 2018-12-04 DIAGNOSIS — E785 Hyperlipidemia, unspecified: Secondary | ICD-10-CM | POA: Diagnosis not present

## 2018-12-24 DIAGNOSIS — L905 Scar conditions and fibrosis of skin: Secondary | ICD-10-CM | POA: Diagnosis not present

## 2018-12-24 DIAGNOSIS — C4442 Squamous cell carcinoma of skin of scalp and neck: Secondary | ICD-10-CM | POA: Diagnosis not present

## 2018-12-28 DIAGNOSIS — E559 Vitamin D deficiency, unspecified: Secondary | ICD-10-CM | POA: Diagnosis not present

## 2018-12-28 DIAGNOSIS — E119 Type 2 diabetes mellitus without complications: Secondary | ICD-10-CM | POA: Diagnosis not present

## 2018-12-28 DIAGNOSIS — R5383 Other fatigue: Secondary | ICD-10-CM | POA: Diagnosis not present

## 2018-12-28 DIAGNOSIS — R7989 Other specified abnormal findings of blood chemistry: Secondary | ICD-10-CM | POA: Diagnosis not present

## 2018-12-28 DIAGNOSIS — E7210 Disorders of sulfur-bearing amino-acid metabolism, unspecified: Secondary | ICD-10-CM | POA: Diagnosis not present

## 2018-12-29 DIAGNOSIS — Z23 Encounter for immunization: Secondary | ICD-10-CM | POA: Diagnosis not present

## 2018-12-29 DIAGNOSIS — R600 Localized edema: Secondary | ICD-10-CM | POA: Diagnosis not present

## 2018-12-29 DIAGNOSIS — L570 Actinic keratosis: Secondary | ICD-10-CM | POA: Diagnosis not present

## 2019-01-04 DIAGNOSIS — E119 Type 2 diabetes mellitus without complications: Secondary | ICD-10-CM | POA: Diagnosis not present

## 2019-01-08 DIAGNOSIS — N183 Chronic kidney disease, stage 3 unspecified: Secondary | ICD-10-CM | POA: Diagnosis not present

## 2019-01-08 DIAGNOSIS — E1169 Type 2 diabetes mellitus with other specified complication: Secondary | ICD-10-CM | POA: Diagnosis not present

## 2019-01-08 DIAGNOSIS — D649 Anemia, unspecified: Secondary | ICD-10-CM | POA: Diagnosis not present

## 2019-01-08 DIAGNOSIS — E785 Hyperlipidemia, unspecified: Secondary | ICD-10-CM | POA: Diagnosis not present

## 2019-01-08 DIAGNOSIS — Z7984 Long term (current) use of oral hypoglycemic drugs: Secondary | ICD-10-CM | POA: Diagnosis not present

## 2019-01-08 DIAGNOSIS — I10 Essential (primary) hypertension: Secondary | ICD-10-CM | POA: Diagnosis not present

## 2019-01-08 DIAGNOSIS — I129 Hypertensive chronic kidney disease with stage 1 through stage 4 chronic kidney disease, or unspecified chronic kidney disease: Secondary | ICD-10-CM | POA: Diagnosis not present

## 2019-01-08 DIAGNOSIS — I5041 Acute combined systolic (congestive) and diastolic (congestive) heart failure: Secondary | ICD-10-CM | POA: Diagnosis not present

## 2019-01-08 DIAGNOSIS — I2510 Atherosclerotic heart disease of native coronary artery without angina pectoris: Secondary | ICD-10-CM | POA: Diagnosis not present

## 2019-01-19 DIAGNOSIS — C4442 Squamous cell carcinoma of skin of scalp and neck: Secondary | ICD-10-CM | POA: Diagnosis not present

## 2019-01-19 DIAGNOSIS — Z23 Encounter for immunization: Secondary | ICD-10-CM | POA: Diagnosis not present

## 2019-01-19 DIAGNOSIS — D485 Neoplasm of uncertain behavior of skin: Secondary | ICD-10-CM | POA: Diagnosis not present

## 2019-01-25 DIAGNOSIS — I5041 Acute combined systolic (congestive) and diastolic (congestive) heart failure: Secondary | ICD-10-CM | POA: Diagnosis not present

## 2019-01-25 DIAGNOSIS — I129 Hypertensive chronic kidney disease with stage 1 through stage 4 chronic kidney disease, or unspecified chronic kidney disease: Secondary | ICD-10-CM | POA: Diagnosis not present

## 2019-01-25 DIAGNOSIS — E1169 Type 2 diabetes mellitus with other specified complication: Secondary | ICD-10-CM | POA: Diagnosis not present

## 2019-01-25 DIAGNOSIS — I2510 Atherosclerotic heart disease of native coronary artery without angina pectoris: Secondary | ICD-10-CM | POA: Diagnosis not present

## 2019-01-25 DIAGNOSIS — E785 Hyperlipidemia, unspecified: Secondary | ICD-10-CM | POA: Diagnosis not present

## 2019-01-25 DIAGNOSIS — I10 Essential (primary) hypertension: Secondary | ICD-10-CM | POA: Diagnosis not present

## 2019-01-25 DIAGNOSIS — N183 Chronic kidney disease, stage 3 unspecified: Secondary | ICD-10-CM | POA: Diagnosis not present

## 2019-01-25 DIAGNOSIS — D649 Anemia, unspecified: Secondary | ICD-10-CM | POA: Diagnosis not present

## 2019-02-08 DIAGNOSIS — I129 Hypertensive chronic kidney disease with stage 1 through stage 4 chronic kidney disease, or unspecified chronic kidney disease: Secondary | ICD-10-CM | POA: Diagnosis not present

## 2019-02-08 DIAGNOSIS — I10 Essential (primary) hypertension: Secondary | ICD-10-CM | POA: Diagnosis not present

## 2019-02-08 DIAGNOSIS — D649 Anemia, unspecified: Secondary | ICD-10-CM | POA: Diagnosis not present

## 2019-02-08 DIAGNOSIS — N183 Chronic kidney disease, stage 3 unspecified: Secondary | ICD-10-CM | POA: Diagnosis not present

## 2019-02-08 DIAGNOSIS — I2510 Atherosclerotic heart disease of native coronary artery without angina pectoris: Secondary | ICD-10-CM | POA: Diagnosis not present

## 2019-02-08 DIAGNOSIS — E1169 Type 2 diabetes mellitus with other specified complication: Secondary | ICD-10-CM | POA: Diagnosis not present

## 2019-02-08 DIAGNOSIS — I5041 Acute combined systolic (congestive) and diastolic (congestive) heart failure: Secondary | ICD-10-CM | POA: Diagnosis not present

## 2019-02-08 DIAGNOSIS — E785 Hyperlipidemia, unspecified: Secondary | ICD-10-CM | POA: Diagnosis not present

## 2019-02-18 DIAGNOSIS — D225 Melanocytic nevi of trunk: Secondary | ICD-10-CM | POA: Diagnosis not present

## 2019-02-18 DIAGNOSIS — D485 Neoplasm of uncertain behavior of skin: Secondary | ICD-10-CM | POA: Diagnosis not present

## 2019-02-18 DIAGNOSIS — D0461 Carcinoma in situ of skin of right upper limb, including shoulder: Secondary | ICD-10-CM | POA: Diagnosis not present

## 2019-02-18 DIAGNOSIS — Z85828 Personal history of other malignant neoplasm of skin: Secondary | ICD-10-CM | POA: Diagnosis not present

## 2019-02-18 DIAGNOSIS — D048 Carcinoma in situ of skin of other sites: Secondary | ICD-10-CM | POA: Diagnosis not present

## 2019-02-18 DIAGNOSIS — D044 Carcinoma in situ of skin of scalp and neck: Secondary | ICD-10-CM | POA: Diagnosis not present

## 2019-02-18 DIAGNOSIS — L570 Actinic keratosis: Secondary | ICD-10-CM | POA: Diagnosis not present

## 2019-02-18 DIAGNOSIS — L720 Epidermal cyst: Secondary | ICD-10-CM | POA: Diagnosis not present

## 2019-02-18 DIAGNOSIS — Z23 Encounter for immunization: Secondary | ICD-10-CM | POA: Diagnosis not present

## 2019-02-18 DIAGNOSIS — L821 Other seborrheic keratosis: Secondary | ICD-10-CM | POA: Diagnosis not present

## 2019-02-25 DIAGNOSIS — J069 Acute upper respiratory infection, unspecified: Secondary | ICD-10-CM | POA: Diagnosis not present

## 2019-02-25 DIAGNOSIS — R05 Cough: Secondary | ICD-10-CM | POA: Diagnosis not present

## 2019-02-25 DIAGNOSIS — U071 COVID-19: Secondary | ICD-10-CM | POA: Diagnosis not present

## 2019-02-25 DIAGNOSIS — J029 Acute pharyngitis, unspecified: Secondary | ICD-10-CM | POA: Diagnosis not present

## 2019-02-25 DIAGNOSIS — R509 Fever, unspecified: Secondary | ICD-10-CM | POA: Diagnosis not present

## 2019-02-26 DIAGNOSIS — E119 Type 2 diabetes mellitus without complications: Secondary | ICD-10-CM | POA: Diagnosis not present

## 2019-03-04 DIAGNOSIS — Z950 Presence of cardiac pacemaker: Secondary | ICD-10-CM | POA: Diagnosis not present

## 2019-03-09 DIAGNOSIS — E785 Hyperlipidemia, unspecified: Secondary | ICD-10-CM | POA: Diagnosis not present

## 2019-03-09 DIAGNOSIS — D649 Anemia, unspecified: Secondary | ICD-10-CM | POA: Diagnosis not present

## 2019-03-09 DIAGNOSIS — I5041 Acute combined systolic (congestive) and diastolic (congestive) heart failure: Secondary | ICD-10-CM | POA: Diagnosis not present

## 2019-03-09 DIAGNOSIS — C4442 Squamous cell carcinoma of skin of scalp and neck: Secondary | ICD-10-CM | POA: Diagnosis not present

## 2019-03-09 DIAGNOSIS — I129 Hypertensive chronic kidney disease with stage 1 through stage 4 chronic kidney disease, or unspecified chronic kidney disease: Secondary | ICD-10-CM | POA: Diagnosis not present

## 2019-03-09 DIAGNOSIS — E1169 Type 2 diabetes mellitus with other specified complication: Secondary | ICD-10-CM | POA: Diagnosis not present

## 2019-03-09 DIAGNOSIS — I2510 Atherosclerotic heart disease of native coronary artery without angina pectoris: Secondary | ICD-10-CM | POA: Diagnosis not present

## 2019-03-09 DIAGNOSIS — I10 Essential (primary) hypertension: Secondary | ICD-10-CM | POA: Diagnosis not present

## 2019-03-16 DIAGNOSIS — I10 Essential (primary) hypertension: Secondary | ICD-10-CM | POA: Diagnosis not present

## 2019-03-16 DIAGNOSIS — R195 Other fecal abnormalities: Secondary | ICD-10-CM | POA: Diagnosis not present

## 2019-03-16 DIAGNOSIS — E1169 Type 2 diabetes mellitus with other specified complication: Secondary | ICD-10-CM | POA: Diagnosis not present

## 2019-03-16 DIAGNOSIS — U071 COVID-19: Secondary | ICD-10-CM | POA: Diagnosis not present

## 2019-03-16 DIAGNOSIS — N1831 Chronic kidney disease, stage 3a: Secondary | ICD-10-CM | POA: Diagnosis not present

## 2019-03-16 DIAGNOSIS — I2510 Atherosclerotic heart disease of native coronary artery without angina pectoris: Secondary | ICD-10-CM | POA: Diagnosis not present

## 2019-03-25 DIAGNOSIS — D0461 Carcinoma in situ of skin of right upper limb, including shoulder: Secondary | ICD-10-CM | POA: Diagnosis not present

## 2019-03-25 DIAGNOSIS — D045 Carcinoma in situ of skin of trunk: Secondary | ICD-10-CM | POA: Diagnosis not present

## 2019-03-25 DIAGNOSIS — L570 Actinic keratosis: Secondary | ICD-10-CM | POA: Diagnosis not present

## 2019-03-25 DIAGNOSIS — D044 Carcinoma in situ of skin of scalp and neck: Secondary | ICD-10-CM | POA: Diagnosis not present

## 2019-03-29 DIAGNOSIS — R5383 Other fatigue: Secondary | ICD-10-CM | POA: Diagnosis not present

## 2019-03-29 DIAGNOSIS — R7989 Other specified abnormal findings of blood chemistry: Secondary | ICD-10-CM | POA: Diagnosis not present

## 2019-03-29 DIAGNOSIS — E119 Type 2 diabetes mellitus without complications: Secondary | ICD-10-CM | POA: Diagnosis not present

## 2019-03-29 DIAGNOSIS — E559 Vitamin D deficiency, unspecified: Secondary | ICD-10-CM | POA: Diagnosis not present

## 2019-03-29 DIAGNOSIS — R972 Elevated prostate specific antigen [PSA]: Secondary | ICD-10-CM | POA: Diagnosis not present

## 2019-03-29 DIAGNOSIS — E7210 Disorders of sulfur-bearing amino-acid metabolism, unspecified: Secondary | ICD-10-CM | POA: Diagnosis not present

## 2019-04-05 DIAGNOSIS — I10 Essential (primary) hypertension: Secondary | ICD-10-CM | POA: Diagnosis not present

## 2019-04-05 DIAGNOSIS — E119 Type 2 diabetes mellitus without complications: Secondary | ICD-10-CM | POA: Diagnosis not present

## 2019-04-05 DIAGNOSIS — I2510 Atherosclerotic heart disease of native coronary artery without angina pectoris: Secondary | ICD-10-CM | POA: Diagnosis not present

## 2019-04-05 DIAGNOSIS — I129 Hypertensive chronic kidney disease with stage 1 through stage 4 chronic kidney disease, or unspecified chronic kidney disease: Secondary | ICD-10-CM | POA: Diagnosis not present

## 2019-04-05 DIAGNOSIS — D649 Anemia, unspecified: Secondary | ICD-10-CM | POA: Diagnosis not present

## 2019-04-05 DIAGNOSIS — E1169 Type 2 diabetes mellitus with other specified complication: Secondary | ICD-10-CM | POA: Diagnosis not present

## 2019-04-05 DIAGNOSIS — Z7984 Long term (current) use of oral hypoglycemic drugs: Secondary | ICD-10-CM | POA: Diagnosis not present

## 2019-04-05 DIAGNOSIS — I5041 Acute combined systolic (congestive) and diastolic (congestive) heart failure: Secondary | ICD-10-CM | POA: Diagnosis not present

## 2019-04-05 DIAGNOSIS — E785 Hyperlipidemia, unspecified: Secondary | ICD-10-CM | POA: Diagnosis not present

## 2019-04-06 DIAGNOSIS — S0100XD Unspecified open wound of scalp, subsequent encounter: Secondary | ICD-10-CM | POA: Diagnosis not present

## 2019-04-06 DIAGNOSIS — E119 Type 2 diabetes mellitus without complications: Secondary | ICD-10-CM | POA: Diagnosis not present

## 2019-04-07 DIAGNOSIS — E119 Type 2 diabetes mellitus without complications: Secondary | ICD-10-CM | POA: Diagnosis not present

## 2019-04-07 DIAGNOSIS — I4891 Unspecified atrial fibrillation: Secondary | ICD-10-CM | POA: Diagnosis not present

## 2019-04-07 DIAGNOSIS — I2510 Atherosclerotic heart disease of native coronary artery without angina pectoris: Secondary | ICD-10-CM | POA: Diagnosis not present

## 2019-04-07 DIAGNOSIS — I110 Hypertensive heart disease with heart failure: Secondary | ICD-10-CM | POA: Diagnosis not present

## 2019-04-07 DIAGNOSIS — E785 Hyperlipidemia, unspecified: Secondary | ICD-10-CM | POA: Diagnosis not present

## 2019-04-07 DIAGNOSIS — H547 Unspecified visual loss: Secondary | ICD-10-CM | POA: Diagnosis not present

## 2019-04-07 DIAGNOSIS — R69 Illness, unspecified: Secondary | ICD-10-CM | POA: Diagnosis not present

## 2019-04-07 DIAGNOSIS — I509 Heart failure, unspecified: Secondary | ICD-10-CM | POA: Diagnosis not present

## 2019-04-07 DIAGNOSIS — G3184 Mild cognitive impairment, so stated: Secondary | ICD-10-CM | POA: Diagnosis not present

## 2019-04-07 DIAGNOSIS — D6869 Other thrombophilia: Secondary | ICD-10-CM | POA: Diagnosis not present

## 2019-04-15 DIAGNOSIS — R69 Illness, unspecified: Secondary | ICD-10-CM | POA: Diagnosis not present

## 2019-04-19 IMAGING — RF CATHETER CHOLANGIOGRAM
2 series · 5 of 5 positions shown · non-contrast
Comparison: none

INDICATION: 89-year-old male with a history of percutaneous cholecystostomy.

[Series 1: sequence · 4 of 42 frames shown]
[frame 7/42]
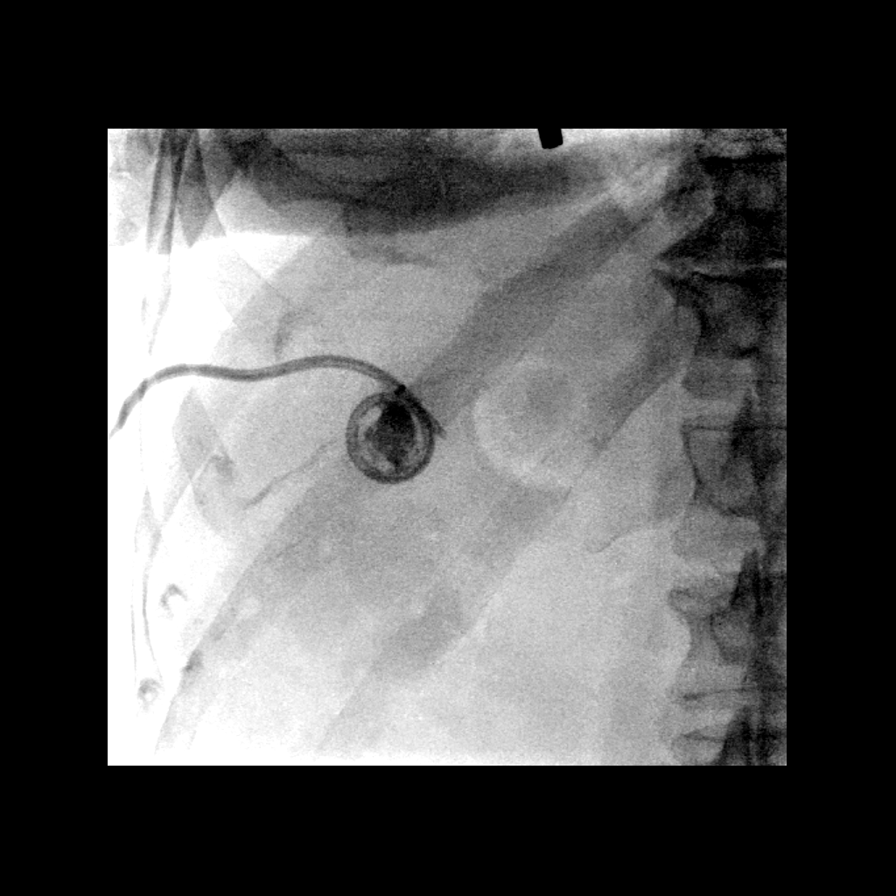
[frame 22/42]
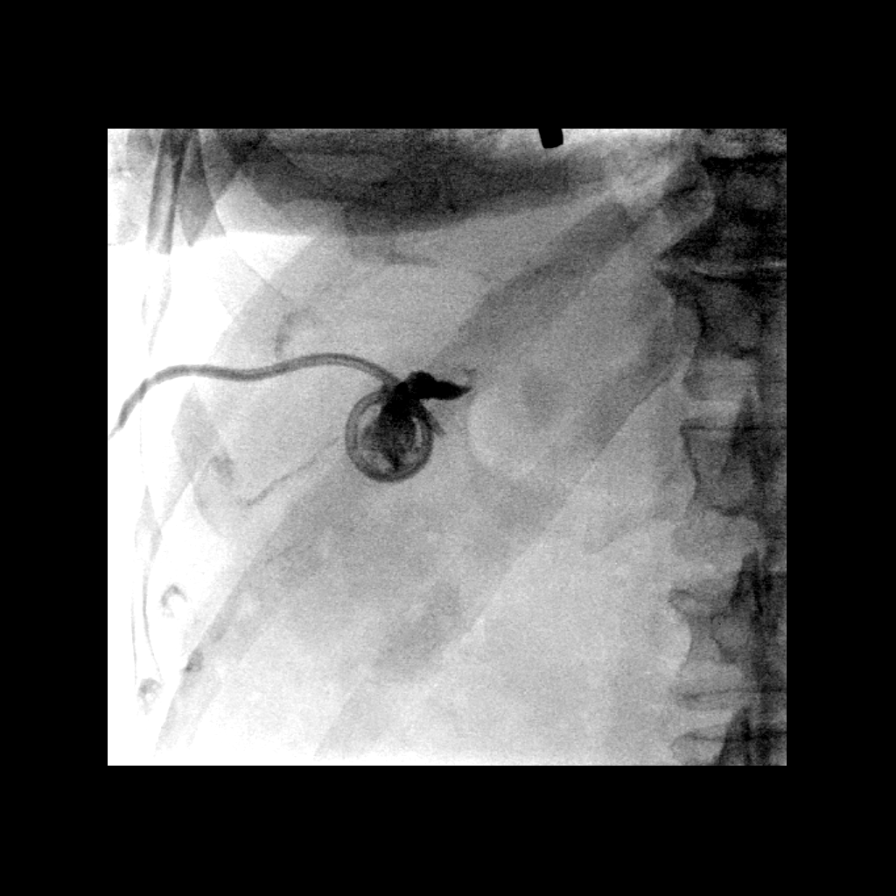
[frame 32/42]
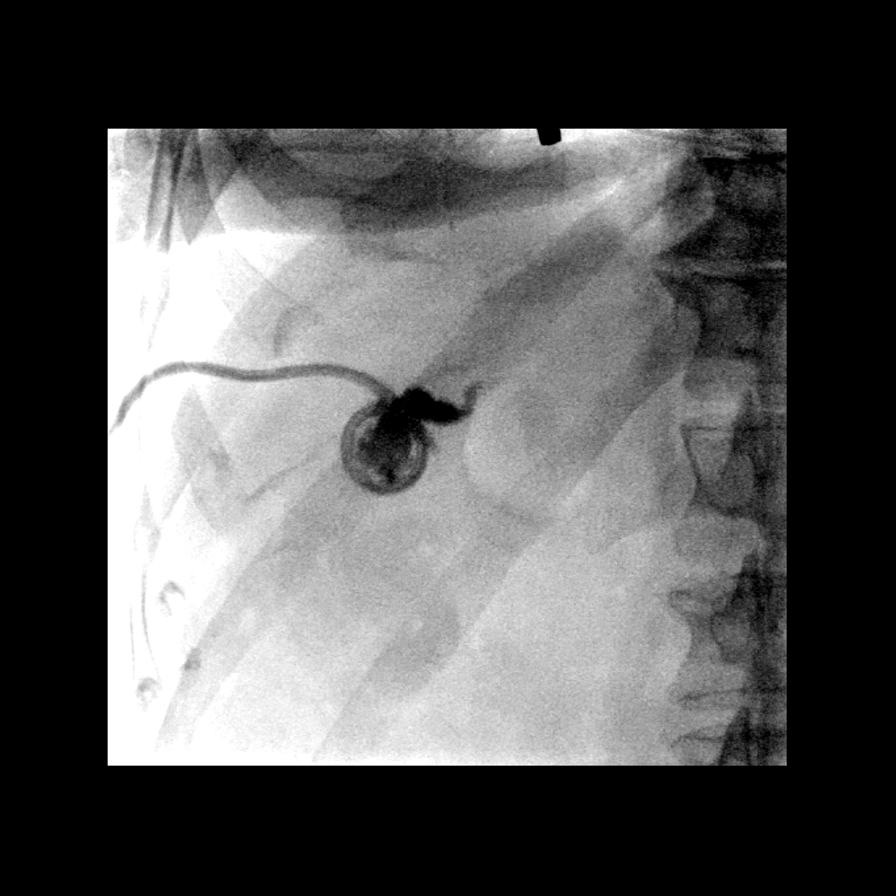
[frame 36/42]
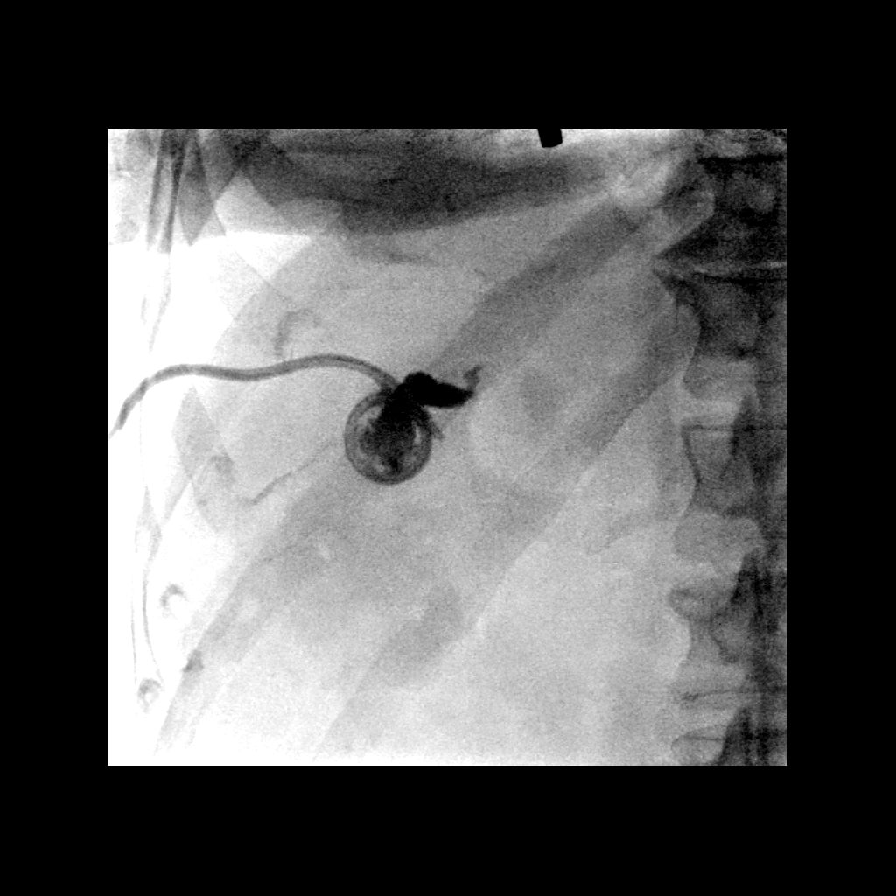

[Series 2: one shot · 0.15mm/px · 1 of 1 slices shown]
[im 1/1]
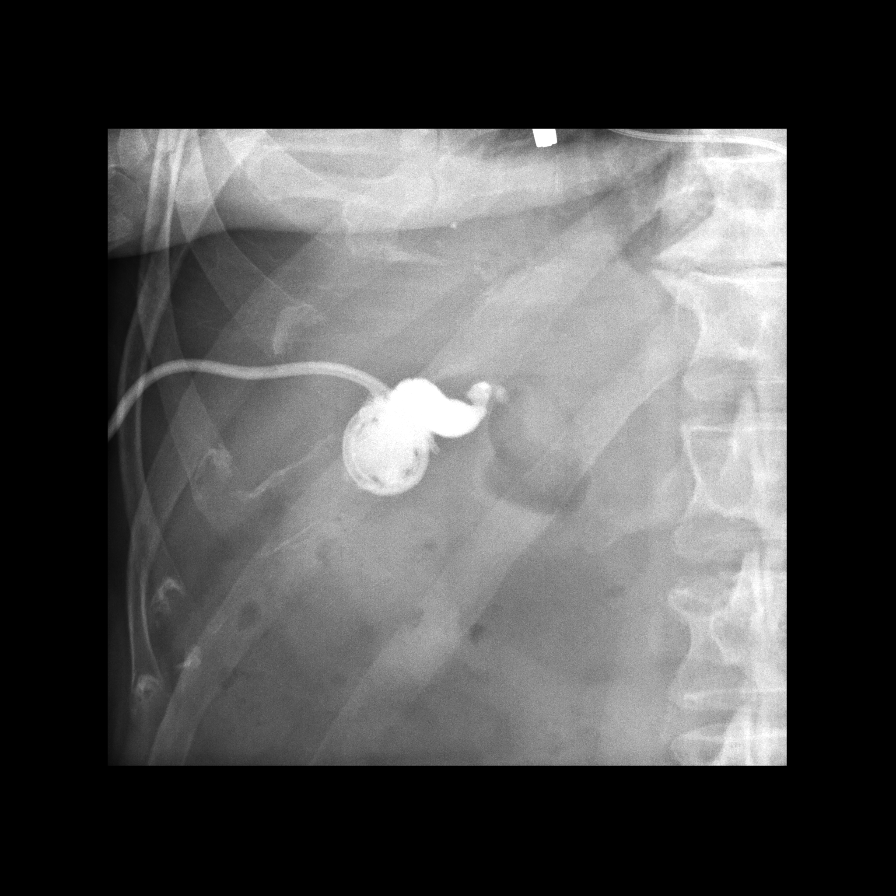

[5 of 5 positions shown; findings below may reference images not displayed]

He returns today for through the tube cholangiogram to evaluate
patency of the system.

EXAM:
THROUGH THE TUBE CHOLANGIOGRAM

MEDICATIONS:
None

ANESTHESIA/SEDATION:
None

COMPLICATIONS:
None

PROCEDURE:
Informed written consent was obtained from the patient after a
thorough discussion of the procedural risks, benefits and
alternatives. All questions were addressed. Maximal Sterile Barrier
Technique was utilized including caps, mask, sterile gowns, sterile
gloves, sterile drape, hand hygiene and skin antiseptic. A timeout
was performed prior to the initiation of the procedure.

Scout images were acquired.

Gentle contrast was injected into the indwelling percutaneous
cholecystostomy tube with real-time fluoroscopic observation.
Approximately 20 cc of sterile saline was then used to flush forward
with further observation. Final images were stored.

Catheter was then reattached to gravity drainage.

Patient tolerated the procedure well and remained hemodynamically
stable throughout.
IMPRESSION: Through the tube cholangiogram via indwelling percutaneous
cholecystostomy demonstrates evidence of persistently occluded
cystic duct.

PLAN:
The indwelling percutaneous cholecystostomy tube was reattached to
gravity drainage, as the cystic duct remains occluded. A follow-up
drain exchange at hospital will be performed in about 6 weeks, at
which time a second injection can be performed to evaluate for
possible cystic duct patency. If patent, capping may be appropriate.

## 2019-05-04 DIAGNOSIS — Z0000 Encounter for general adult medical examination without abnormal findings: Secondary | ICD-10-CM | POA: Diagnosis not present

## 2019-05-04 DIAGNOSIS — Z955 Presence of coronary angioplasty implant and graft: Secondary | ICD-10-CM | POA: Diagnosis not present

## 2019-05-04 DIAGNOSIS — I2510 Atherosclerotic heart disease of native coronary artery without angina pectoris: Secondary | ICD-10-CM | POA: Diagnosis not present

## 2019-05-04 DIAGNOSIS — Z950 Presence of cardiac pacemaker: Secondary | ICD-10-CM | POA: Diagnosis not present

## 2019-05-04 DIAGNOSIS — I10 Essential (primary) hypertension: Secondary | ICD-10-CM | POA: Diagnosis not present

## 2019-05-04 DIAGNOSIS — E785 Hyperlipidemia, unspecified: Secondary | ICD-10-CM | POA: Diagnosis not present

## 2019-05-04 DIAGNOSIS — N1831 Chronic kidney disease, stage 3a: Secondary | ICD-10-CM | POA: Diagnosis not present

## 2019-05-04 DIAGNOSIS — Z1389 Encounter for screening for other disorder: Secondary | ICD-10-CM | POA: Diagnosis not present

## 2019-05-05 DIAGNOSIS — E1169 Type 2 diabetes mellitus with other specified complication: Secondary | ICD-10-CM | POA: Diagnosis not present

## 2019-05-05 DIAGNOSIS — I129 Hypertensive chronic kidney disease with stage 1 through stage 4 chronic kidney disease, or unspecified chronic kidney disease: Secondary | ICD-10-CM | POA: Diagnosis not present

## 2019-05-05 DIAGNOSIS — I10 Essential (primary) hypertension: Secondary | ICD-10-CM | POA: Diagnosis not present

## 2019-05-05 DIAGNOSIS — I2510 Atherosclerotic heart disease of native coronary artery without angina pectoris: Secondary | ICD-10-CM | POA: Diagnosis not present

## 2019-05-05 DIAGNOSIS — I5041 Acute combined systolic (congestive) and diastolic (congestive) heart failure: Secondary | ICD-10-CM | POA: Diagnosis not present

## 2019-05-05 DIAGNOSIS — D649 Anemia, unspecified: Secondary | ICD-10-CM | POA: Diagnosis not present

## 2019-05-05 DIAGNOSIS — E785 Hyperlipidemia, unspecified: Secondary | ICD-10-CM | POA: Diagnosis not present

## 2019-05-19 DIAGNOSIS — L570 Actinic keratosis: Secondary | ICD-10-CM | POA: Diagnosis not present

## 2019-05-26 DIAGNOSIS — E1169 Type 2 diabetes mellitus with other specified complication: Secondary | ICD-10-CM | POA: Diagnosis not present

## 2019-05-26 DIAGNOSIS — N1831 Chronic kidney disease, stage 3a: Secondary | ICD-10-CM | POA: Diagnosis not present

## 2019-05-26 DIAGNOSIS — E785 Hyperlipidemia, unspecified: Secondary | ICD-10-CM | POA: Diagnosis not present

## 2019-05-26 DIAGNOSIS — I5041 Acute combined systolic (congestive) and diastolic (congestive) heart failure: Secondary | ICD-10-CM | POA: Diagnosis not present

## 2019-05-26 DIAGNOSIS — I129 Hypertensive chronic kidney disease with stage 1 through stage 4 chronic kidney disease, or unspecified chronic kidney disease: Secondary | ICD-10-CM | POA: Diagnosis not present

## 2019-05-26 DIAGNOSIS — I2510 Atherosclerotic heart disease of native coronary artery without angina pectoris: Secondary | ICD-10-CM | POA: Diagnosis not present

## 2019-05-26 DIAGNOSIS — I10 Essential (primary) hypertension: Secondary | ICD-10-CM | POA: Diagnosis not present

## 2019-05-26 DIAGNOSIS — D649 Anemia, unspecified: Secondary | ICD-10-CM | POA: Diagnosis not present

## 2019-06-03 DIAGNOSIS — Z950 Presence of cardiac pacemaker: Secondary | ICD-10-CM | POA: Diagnosis not present

## 2019-06-24 DIAGNOSIS — H35372 Puckering of macula, left eye: Secondary | ICD-10-CM | POA: Diagnosis not present

## 2019-06-24 DIAGNOSIS — H0102A Squamous blepharitis right eye, upper and lower eyelids: Secondary | ICD-10-CM | POA: Diagnosis not present

## 2019-06-24 DIAGNOSIS — E119 Type 2 diabetes mellitus without complications: Secondary | ICD-10-CM | POA: Diagnosis not present

## 2019-06-24 DIAGNOSIS — Q111 Other anophthalmos: Secondary | ICD-10-CM | POA: Diagnosis not present

## 2019-06-24 DIAGNOSIS — H0102B Squamous blepharitis left eye, upper and lower eyelids: Secondary | ICD-10-CM | POA: Diagnosis not present

## 2019-06-24 DIAGNOSIS — H353122 Nonexudative age-related macular degeneration, left eye, intermediate dry stage: Secondary | ICD-10-CM | POA: Diagnosis not present

## 2019-06-24 DIAGNOSIS — H17822 Peripheral opacity of cornea, left eye: Secondary | ICD-10-CM | POA: Diagnosis not present

## 2019-07-05 DIAGNOSIS — R5383 Other fatigue: Secondary | ICD-10-CM | POA: Diagnosis not present

## 2019-07-05 DIAGNOSIS — E119 Type 2 diabetes mellitus without complications: Secondary | ICD-10-CM | POA: Diagnosis not present

## 2019-07-05 DIAGNOSIS — E7210 Disorders of sulfur-bearing amino-acid metabolism, unspecified: Secondary | ICD-10-CM | POA: Diagnosis not present

## 2019-07-05 DIAGNOSIS — E559 Vitamin D deficiency, unspecified: Secondary | ICD-10-CM | POA: Diagnosis not present

## 2019-07-05 DIAGNOSIS — R7989 Other specified abnormal findings of blood chemistry: Secondary | ICD-10-CM | POA: Diagnosis not present

## 2019-07-05 DIAGNOSIS — I7090 Unspecified atherosclerosis: Secondary | ICD-10-CM | POA: Diagnosis not present

## 2019-07-07 DIAGNOSIS — E785 Hyperlipidemia, unspecified: Secondary | ICD-10-CM | POA: Diagnosis not present

## 2019-07-07 DIAGNOSIS — I2510 Atherosclerotic heart disease of native coronary artery without angina pectoris: Secondary | ICD-10-CM | POA: Diagnosis not present

## 2019-07-07 DIAGNOSIS — D649 Anemia, unspecified: Secondary | ICD-10-CM | POA: Diagnosis not present

## 2019-07-07 DIAGNOSIS — E1169 Type 2 diabetes mellitus with other specified complication: Secondary | ICD-10-CM | POA: Diagnosis not present

## 2019-07-07 DIAGNOSIS — I5041 Acute combined systolic (congestive) and diastolic (congestive) heart failure: Secondary | ICD-10-CM | POA: Diagnosis not present

## 2019-07-07 DIAGNOSIS — I10 Essential (primary) hypertension: Secondary | ICD-10-CM | POA: Diagnosis not present

## 2019-07-07 DIAGNOSIS — N1831 Chronic kidney disease, stage 3a: Secondary | ICD-10-CM | POA: Diagnosis not present

## 2019-07-07 DIAGNOSIS — I129 Hypertensive chronic kidney disease with stage 1 through stage 4 chronic kidney disease, or unspecified chronic kidney disease: Secondary | ICD-10-CM | POA: Diagnosis not present

## 2019-07-08 ENCOUNTER — Ambulatory Visit (INDEPENDENT_AMBULATORY_CARE_PROVIDER_SITE_OTHER): Payer: Medicare HMO | Admitting: Ophthalmology

## 2019-07-08 ENCOUNTER — Encounter (INDEPENDENT_AMBULATORY_CARE_PROVIDER_SITE_OTHER): Payer: Self-pay | Admitting: Ophthalmology

## 2019-07-08 ENCOUNTER — Other Ambulatory Visit: Payer: Self-pay

## 2019-07-08 DIAGNOSIS — H43392 Other vitreous opacities, left eye: Secondary | ICD-10-CM | POA: Insufficient documentation

## 2019-07-08 DIAGNOSIS — Q111 Other anophthalmos: Secondary | ICD-10-CM | POA: Diagnosis not present

## 2019-07-08 DIAGNOSIS — H353123 Nonexudative age-related macular degeneration, left eye, advanced atrophic without subfoveal involvement: Secondary | ICD-10-CM

## 2019-07-08 DIAGNOSIS — Z9001 Acquired absence of eye: Secondary | ICD-10-CM | POA: Diagnosis not present

## 2019-07-08 NOTE — Assessment & Plan Note (Signed)
OS, will need to monitor at home for new onset visual acuity distortions.  Should they arise patient is to contact the office immediately to seek evaluation.,  There is no current active maculopathy OS
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
## 2019-07-13 DIAGNOSIS — E119 Type 2 diabetes mellitus without complications: Secondary | ICD-10-CM | POA: Diagnosis not present

## 2019-07-20 DIAGNOSIS — R69 Illness, unspecified: Secondary | ICD-10-CM | POA: Diagnosis not present

## 2019-07-21 DIAGNOSIS — E785 Hyperlipidemia, unspecified: Secondary | ICD-10-CM | POA: Diagnosis not present

## 2019-07-21 DIAGNOSIS — I2510 Atherosclerotic heart disease of native coronary artery without angina pectoris: Secondary | ICD-10-CM | POA: Diagnosis not present

## 2019-07-21 DIAGNOSIS — I10 Essential (primary) hypertension: Secondary | ICD-10-CM | POA: Diagnosis not present

## 2019-07-21 DIAGNOSIS — N1831 Chronic kidney disease, stage 3a: Secondary | ICD-10-CM | POA: Diagnosis not present

## 2019-07-21 DIAGNOSIS — I129 Hypertensive chronic kidney disease with stage 1 through stage 4 chronic kidney disease, or unspecified chronic kidney disease: Secondary | ICD-10-CM | POA: Diagnosis not present

## 2019-07-21 DIAGNOSIS — D649 Anemia, unspecified: Secondary | ICD-10-CM | POA: Diagnosis not present

## 2019-07-21 DIAGNOSIS — E1169 Type 2 diabetes mellitus with other specified complication: Secondary | ICD-10-CM | POA: Diagnosis not present

## 2019-07-21 DIAGNOSIS — I5041 Acute combined systolic (congestive) and diastolic (congestive) heart failure: Secondary | ICD-10-CM | POA: Diagnosis not present

## 2019-07-22 DIAGNOSIS — D485 Neoplasm of uncertain behavior of skin: Secondary | ICD-10-CM | POA: Diagnosis not present

## 2019-07-22 DIAGNOSIS — C44622 Squamous cell carcinoma of skin of right upper limb, including shoulder: Secondary | ICD-10-CM | POA: Diagnosis not present

## 2019-07-27 DIAGNOSIS — I459 Conduction disorder, unspecified: Secondary | ICD-10-CM | POA: Diagnosis not present

## 2019-07-27 DIAGNOSIS — Z950 Presence of cardiac pacemaker: Secondary | ICD-10-CM | POA: Diagnosis not present

## 2019-07-27 DIAGNOSIS — I498 Other specified cardiac arrhythmias: Secondary | ICD-10-CM | POA: Diagnosis not present

## 2019-07-27 DIAGNOSIS — R5381 Other malaise: Secondary | ICD-10-CM | POA: Diagnosis not present

## 2019-07-27 DIAGNOSIS — E785 Hyperlipidemia, unspecified: Secondary | ICD-10-CM | POA: Diagnosis not present

## 2019-07-27 DIAGNOSIS — J101 Influenza due to other identified influenza virus with other respiratory manifestations: Secondary | ICD-10-CM | POA: Diagnosis not present

## 2019-07-27 DIAGNOSIS — E1165 Type 2 diabetes mellitus with hyperglycemia: Secondary | ICD-10-CM | POA: Diagnosis not present

## 2019-07-27 DIAGNOSIS — I5032 Chronic diastolic (congestive) heart failure: Secondary | ICD-10-CM | POA: Diagnosis not present

## 2019-07-27 DIAGNOSIS — I110 Hypertensive heart disease with heart failure: Secondary | ICD-10-CM | POA: Diagnosis not present

## 2019-07-27 DIAGNOSIS — R001 Bradycardia, unspecified: Secondary | ICD-10-CM | POA: Diagnosis not present

## 2019-08-16 DIAGNOSIS — C44622 Squamous cell carcinoma of skin of right upper limb, including shoulder: Secondary | ICD-10-CM | POA: Diagnosis not present

## 2019-08-19 DIAGNOSIS — I10 Essential (primary) hypertension: Secondary | ICD-10-CM | POA: Diagnosis not present

## 2019-08-19 DIAGNOSIS — N1831 Chronic kidney disease, stage 3a: Secondary | ICD-10-CM | POA: Diagnosis not present

## 2019-08-19 DIAGNOSIS — I129 Hypertensive chronic kidney disease with stage 1 through stage 4 chronic kidney disease, or unspecified chronic kidney disease: Secondary | ICD-10-CM | POA: Diagnosis not present

## 2019-08-19 DIAGNOSIS — L720 Epidermal cyst: Secondary | ICD-10-CM | POA: Diagnosis not present

## 2019-08-19 DIAGNOSIS — E785 Hyperlipidemia, unspecified: Secondary | ICD-10-CM | POA: Diagnosis not present

## 2019-08-19 DIAGNOSIS — D649 Anemia, unspecified: Secondary | ICD-10-CM | POA: Diagnosis not present

## 2019-08-19 DIAGNOSIS — Z85828 Personal history of other malignant neoplasm of skin: Secondary | ICD-10-CM | POA: Diagnosis not present

## 2019-08-19 DIAGNOSIS — L570 Actinic keratosis: Secondary | ICD-10-CM | POA: Diagnosis not present

## 2019-08-19 DIAGNOSIS — I5041 Acute combined systolic (congestive) and diastolic (congestive) heart failure: Secondary | ICD-10-CM | POA: Diagnosis not present

## 2019-08-19 DIAGNOSIS — I2510 Atherosclerotic heart disease of native coronary artery without angina pectoris: Secondary | ICD-10-CM | POA: Diagnosis not present

## 2019-08-19 DIAGNOSIS — E1169 Type 2 diabetes mellitus with other specified complication: Secondary | ICD-10-CM | POA: Diagnosis not present

## 2019-08-19 DIAGNOSIS — L578 Other skin changes due to chronic exposure to nonionizing radiation: Secondary | ICD-10-CM | POA: Diagnosis not present

## 2019-08-19 DIAGNOSIS — D225 Melanocytic nevi of trunk: Secondary | ICD-10-CM | POA: Diagnosis not present

## 2019-08-19 DIAGNOSIS — L821 Other seborrheic keratosis: Secondary | ICD-10-CM | POA: Diagnosis not present

## 2019-09-02 DIAGNOSIS — Z950 Presence of cardiac pacemaker: Secondary | ICD-10-CM | POA: Diagnosis not present

## 2019-09-10 DIAGNOSIS — E1169 Type 2 diabetes mellitus with other specified complication: Secondary | ICD-10-CM | POA: Diagnosis not present

## 2019-09-10 DIAGNOSIS — I2510 Atherosclerotic heart disease of native coronary artery without angina pectoris: Secondary | ICD-10-CM | POA: Diagnosis not present

## 2019-09-10 DIAGNOSIS — I10 Essential (primary) hypertension: Secondary | ICD-10-CM | POA: Diagnosis not present

## 2019-09-10 DIAGNOSIS — I5041 Acute combined systolic (congestive) and diastolic (congestive) heart failure: Secondary | ICD-10-CM | POA: Diagnosis not present

## 2019-09-10 DIAGNOSIS — N1831 Chronic kidney disease, stage 3a: Secondary | ICD-10-CM | POA: Diagnosis not present

## 2019-09-10 DIAGNOSIS — I129 Hypertensive chronic kidney disease with stage 1 through stage 4 chronic kidney disease, or unspecified chronic kidney disease: Secondary | ICD-10-CM | POA: Diagnosis not present

## 2019-09-10 DIAGNOSIS — D649 Anemia, unspecified: Secondary | ICD-10-CM | POA: Diagnosis not present

## 2019-09-10 DIAGNOSIS — E785 Hyperlipidemia, unspecified: Secondary | ICD-10-CM | POA: Diagnosis not present

## 2019-10-12 DIAGNOSIS — E119 Type 2 diabetes mellitus without complications: Secondary | ICD-10-CM | POA: Diagnosis not present

## 2019-10-12 DIAGNOSIS — E559 Vitamin D deficiency, unspecified: Secondary | ICD-10-CM | POA: Diagnosis not present

## 2019-10-12 DIAGNOSIS — R7989 Other specified abnormal findings of blood chemistry: Secondary | ICD-10-CM | POA: Diagnosis not present

## 2019-10-12 DIAGNOSIS — I7090 Unspecified atherosclerosis: Secondary | ICD-10-CM | POA: Diagnosis not present

## 2019-10-12 DIAGNOSIS — E7210 Disorders of sulfur-bearing amino-acid metabolism, unspecified: Secondary | ICD-10-CM | POA: Diagnosis not present

## 2019-10-19 DIAGNOSIS — E119 Type 2 diabetes mellitus without complications: Secondary | ICD-10-CM | POA: Diagnosis not present

## 2019-11-02 DIAGNOSIS — D649 Anemia, unspecified: Secondary | ICD-10-CM | POA: Diagnosis not present

## 2019-11-02 DIAGNOSIS — I2510 Atherosclerotic heart disease of native coronary artery without angina pectoris: Secondary | ICD-10-CM | POA: Diagnosis not present

## 2019-11-02 DIAGNOSIS — E785 Hyperlipidemia, unspecified: Secondary | ICD-10-CM | POA: Diagnosis not present

## 2019-11-02 DIAGNOSIS — E1169 Type 2 diabetes mellitus with other specified complication: Secondary | ICD-10-CM | POA: Diagnosis not present

## 2019-11-02 DIAGNOSIS — I5041 Acute combined systolic (congestive) and diastolic (congestive) heart failure: Secondary | ICD-10-CM | POA: Diagnosis not present

## 2019-11-02 DIAGNOSIS — I10 Essential (primary) hypertension: Secondary | ICD-10-CM | POA: Diagnosis not present

## 2019-11-02 DIAGNOSIS — I129 Hypertensive chronic kidney disease with stage 1 through stage 4 chronic kidney disease, or unspecified chronic kidney disease: Secondary | ICD-10-CM | POA: Diagnosis not present

## 2019-11-02 DIAGNOSIS — N1831 Chronic kidney disease, stage 3a: Secondary | ICD-10-CM | POA: Diagnosis not present

## 2019-11-03 DIAGNOSIS — I10 Essential (primary) hypertension: Secondary | ICD-10-CM | POA: Diagnosis not present

## 2019-11-03 DIAGNOSIS — I129 Hypertensive chronic kidney disease with stage 1 through stage 4 chronic kidney disease, or unspecified chronic kidney disease: Secondary | ICD-10-CM | POA: Diagnosis not present

## 2019-11-03 DIAGNOSIS — E1169 Type 2 diabetes mellitus with other specified complication: Secondary | ICD-10-CM | POA: Diagnosis not present

## 2019-11-22 DIAGNOSIS — D485 Neoplasm of uncertain behavior of skin: Secondary | ICD-10-CM | POA: Diagnosis not present

## 2019-11-22 DIAGNOSIS — L570 Actinic keratosis: Secondary | ICD-10-CM | POA: Diagnosis not present

## 2019-11-22 DIAGNOSIS — C4442 Squamous cell carcinoma of skin of scalp and neck: Secondary | ICD-10-CM | POA: Diagnosis not present

## 2019-11-22 DIAGNOSIS — L723 Sebaceous cyst: Secondary | ICD-10-CM | POA: Diagnosis not present

## 2019-11-29 DIAGNOSIS — R69 Illness, unspecified: Secondary | ICD-10-CM | POA: Diagnosis not present

## 2019-12-05 DIAGNOSIS — N1831 Chronic kidney disease, stage 3a: Secondary | ICD-10-CM | POA: Diagnosis not present

## 2019-12-05 DIAGNOSIS — E785 Hyperlipidemia, unspecified: Secondary | ICD-10-CM | POA: Diagnosis not present

## 2019-12-05 DIAGNOSIS — I129 Hypertensive chronic kidney disease with stage 1 through stage 4 chronic kidney disease, or unspecified chronic kidney disease: Secondary | ICD-10-CM | POA: Diagnosis not present

## 2019-12-05 DIAGNOSIS — I5041 Acute combined systolic (congestive) and diastolic (congestive) heart failure: Secondary | ICD-10-CM | POA: Diagnosis not present

## 2019-12-05 DIAGNOSIS — D649 Anemia, unspecified: Secondary | ICD-10-CM | POA: Diagnosis not present

## 2019-12-05 DIAGNOSIS — I10 Essential (primary) hypertension: Secondary | ICD-10-CM | POA: Diagnosis not present

## 2019-12-05 DIAGNOSIS — I2510 Atherosclerotic heart disease of native coronary artery without angina pectoris: Secondary | ICD-10-CM | POA: Diagnosis not present

## 2019-12-05 DIAGNOSIS — E1169 Type 2 diabetes mellitus with other specified complication: Secondary | ICD-10-CM | POA: Diagnosis not present

## 2019-12-06 DIAGNOSIS — C4442 Squamous cell carcinoma of skin of scalp and neck: Secondary | ICD-10-CM | POA: Diagnosis not present

## 2019-12-07 DIAGNOSIS — I498 Other specified cardiac arrhythmias: Secondary | ICD-10-CM | POA: Diagnosis not present

## 2019-12-07 DIAGNOSIS — Z45018 Encounter for adjustment and management of other part of cardiac pacemaker: Secondary | ICD-10-CM | POA: Diagnosis not present

## 2019-12-24 DIAGNOSIS — H6123 Impacted cerumen, bilateral: Secondary | ICD-10-CM | POA: Diagnosis not present

## 2019-12-24 DIAGNOSIS — H903 Sensorineural hearing loss, bilateral: Secondary | ICD-10-CM | POA: Diagnosis not present

## 2020-01-06 ENCOUNTER — Encounter (INDEPENDENT_AMBULATORY_CARE_PROVIDER_SITE_OTHER): Payer: Medicare HMO | Admitting: Ophthalmology

## 2020-01-06 DIAGNOSIS — H353122 Nonexudative age-related macular degeneration, left eye, intermediate dry stage: Secondary | ICD-10-CM | POA: Diagnosis not present

## 2020-01-06 DIAGNOSIS — H0102B Squamous blepharitis left eye, upper and lower eyelids: Secondary | ICD-10-CM | POA: Diagnosis not present

## 2020-01-06 DIAGNOSIS — H0102A Squamous blepharitis right eye, upper and lower eyelids: Secondary | ICD-10-CM | POA: Diagnosis not present

## 2020-01-06 DIAGNOSIS — H35372 Puckering of macula, left eye: Secondary | ICD-10-CM | POA: Diagnosis not present

## 2020-01-06 DIAGNOSIS — H17822 Peripheral opacity of cornea, left eye: Secondary | ICD-10-CM | POA: Diagnosis not present

## 2020-01-06 DIAGNOSIS — E119 Type 2 diabetes mellitus without complications: Secondary | ICD-10-CM | POA: Diagnosis not present

## 2020-01-06 DIAGNOSIS — Q111 Other anophthalmos: Secondary | ICD-10-CM | POA: Diagnosis not present

## 2020-01-07 DIAGNOSIS — I10 Essential (primary) hypertension: Secondary | ICD-10-CM | POA: Diagnosis not present

## 2020-01-07 DIAGNOSIS — N1831 Chronic kidney disease, stage 3a: Secondary | ICD-10-CM | POA: Diagnosis not present

## 2020-01-07 DIAGNOSIS — E1169 Type 2 diabetes mellitus with other specified complication: Secondary | ICD-10-CM | POA: Diagnosis not present

## 2020-01-07 DIAGNOSIS — D649 Anemia, unspecified: Secondary | ICD-10-CM | POA: Diagnosis not present

## 2020-01-07 DIAGNOSIS — I129 Hypertensive chronic kidney disease with stage 1 through stage 4 chronic kidney disease, or unspecified chronic kidney disease: Secondary | ICD-10-CM | POA: Diagnosis not present

## 2020-01-07 DIAGNOSIS — E785 Hyperlipidemia, unspecified: Secondary | ICD-10-CM | POA: Diagnosis not present

## 2020-01-07 DIAGNOSIS — I5041 Acute combined systolic (congestive) and diastolic (congestive) heart failure: Secondary | ICD-10-CM | POA: Diagnosis not present

## 2020-01-07 DIAGNOSIS — I2510 Atherosclerotic heart disease of native coronary artery without angina pectoris: Secondary | ICD-10-CM | POA: Diagnosis not present

## 2020-01-18 ENCOUNTER — Encounter (INDEPENDENT_AMBULATORY_CARE_PROVIDER_SITE_OTHER): Payer: Medicare HMO | Admitting: Ophthalmology

## 2020-01-18 DIAGNOSIS — E559 Vitamin D deficiency, unspecified: Secondary | ICD-10-CM | POA: Diagnosis not present

## 2020-01-18 DIAGNOSIS — R7989 Other specified abnormal findings of blood chemistry: Secondary | ICD-10-CM | POA: Diagnosis not present

## 2020-01-18 DIAGNOSIS — H17822 Peripheral opacity of cornea, left eye: Secondary | ICD-10-CM | POA: Diagnosis not present

## 2020-01-18 DIAGNOSIS — H0102B Squamous blepharitis left eye, upper and lower eyelids: Secondary | ICD-10-CM | POA: Diagnosis not present

## 2020-01-18 DIAGNOSIS — Q111 Other anophthalmos: Secondary | ICD-10-CM | POA: Diagnosis not present

## 2020-01-18 DIAGNOSIS — E7210 Disorders of sulfur-bearing amino-acid metabolism, unspecified: Secondary | ICD-10-CM | POA: Diagnosis not present

## 2020-01-18 DIAGNOSIS — E119 Type 2 diabetes mellitus without complications: Secondary | ICD-10-CM | POA: Diagnosis not present

## 2020-01-18 DIAGNOSIS — H0102A Squamous blepharitis right eye, upper and lower eyelids: Secondary | ICD-10-CM | POA: Diagnosis not present

## 2020-01-19 DIAGNOSIS — E119 Type 2 diabetes mellitus without complications: Secondary | ICD-10-CM | POA: Diagnosis not present

## 2020-01-20 ENCOUNTER — Ambulatory Visit (INDEPENDENT_AMBULATORY_CARE_PROVIDER_SITE_OTHER): Payer: Medicare HMO | Admitting: Ophthalmology

## 2020-01-20 ENCOUNTER — Other Ambulatory Visit: Payer: Self-pay

## 2020-01-20 ENCOUNTER — Encounter (INDEPENDENT_AMBULATORY_CARE_PROVIDER_SITE_OTHER): Payer: Self-pay | Admitting: Ophthalmology

## 2020-01-20 DIAGNOSIS — H16002 Unspecified corneal ulcer, left eye: Secondary | ICD-10-CM | POA: Diagnosis not present

## 2020-01-20 DIAGNOSIS — H35372 Puckering of macula, left eye: Secondary | ICD-10-CM | POA: Diagnosis not present

## 2020-01-20 DIAGNOSIS — H353123 Nonexudative age-related macular degeneration, left eye, advanced atrophic without subfoveal involvement: Secondary | ICD-10-CM

## 2020-01-20 NOTE — Assessment & Plan Note (Signed)
Minor, no intervention required

## 2020-01-20 NOTE — Assessment & Plan Note (Signed)
Under the care of Dr. Annia Belt and awaiting referral appointment with corneal specialist at [facility], scheduled for next week
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
## 2020-01-21 DIAGNOSIS — D649 Anemia, unspecified: Secondary | ICD-10-CM | POA: Diagnosis not present

## 2020-01-21 DIAGNOSIS — N1831 Chronic kidney disease, stage 3a: Secondary | ICD-10-CM | POA: Diagnosis not present

## 2020-01-21 DIAGNOSIS — I2510 Atherosclerotic heart disease of native coronary artery without angina pectoris: Secondary | ICD-10-CM | POA: Diagnosis not present

## 2020-01-21 DIAGNOSIS — K219 Gastro-esophageal reflux disease without esophagitis: Secondary | ICD-10-CM | POA: Diagnosis not present

## 2020-01-21 DIAGNOSIS — E1169 Type 2 diabetes mellitus with other specified complication: Secondary | ICD-10-CM | POA: Diagnosis not present

## 2020-01-21 DIAGNOSIS — I129 Hypertensive chronic kidney disease with stage 1 through stage 4 chronic kidney disease, or unspecified chronic kidney disease: Secondary | ICD-10-CM | POA: Diagnosis not present

## 2020-01-21 DIAGNOSIS — I10 Essential (primary) hypertension: Secondary | ICD-10-CM | POA: Diagnosis not present

## 2020-01-21 DIAGNOSIS — I5041 Acute combined systolic (congestive) and diastolic (congestive) heart failure: Secondary | ICD-10-CM | POA: Diagnosis not present

## 2020-01-21 DIAGNOSIS — E785 Hyperlipidemia, unspecified: Secondary | ICD-10-CM | POA: Diagnosis not present

## 2020-01-27 DIAGNOSIS — E119 Type 2 diabetes mellitus without complications: Secondary | ICD-10-CM | POA: Diagnosis not present

## 2020-02-18 DIAGNOSIS — I10 Essential (primary) hypertension: Secondary | ICD-10-CM | POA: Diagnosis not present

## 2020-02-18 DIAGNOSIS — N1831 Chronic kidney disease, stage 3a: Secondary | ICD-10-CM | POA: Diagnosis not present

## 2020-02-18 DIAGNOSIS — I5041 Acute combined systolic (congestive) and diastolic (congestive) heart failure: Secondary | ICD-10-CM | POA: Diagnosis not present

## 2020-02-18 DIAGNOSIS — E785 Hyperlipidemia, unspecified: Secondary | ICD-10-CM | POA: Diagnosis not present

## 2020-02-18 DIAGNOSIS — E1169 Type 2 diabetes mellitus with other specified complication: Secondary | ICD-10-CM | POA: Diagnosis not present

## 2020-02-18 DIAGNOSIS — I2510 Atherosclerotic heart disease of native coronary artery without angina pectoris: Secondary | ICD-10-CM | POA: Diagnosis not present

## 2020-02-18 DIAGNOSIS — I129 Hypertensive chronic kidney disease with stage 1 through stage 4 chronic kidney disease, or unspecified chronic kidney disease: Secondary | ICD-10-CM | POA: Diagnosis not present

## 2020-02-18 DIAGNOSIS — D649 Anemia, unspecified: Secondary | ICD-10-CM | POA: Diagnosis not present

## 2020-02-18 DIAGNOSIS — K219 Gastro-esophageal reflux disease without esophagitis: Secondary | ICD-10-CM | POA: Diagnosis not present

## 2020-03-07 DIAGNOSIS — Z45018 Encounter for adjustment and management of other part of cardiac pacemaker: Secondary | ICD-10-CM | POA: Diagnosis not present

## 2020-03-09 DIAGNOSIS — L570 Actinic keratosis: Secondary | ICD-10-CM | POA: Diagnosis not present

## 2020-03-09 DIAGNOSIS — Z85828 Personal history of other malignant neoplasm of skin: Secondary | ICD-10-CM | POA: Diagnosis not present

## 2020-03-09 DIAGNOSIS — D485 Neoplasm of uncertain behavior of skin: Secondary | ICD-10-CM | POA: Diagnosis not present

## 2020-03-09 DIAGNOSIS — L578 Other skin changes due to chronic exposure to nonionizing radiation: Secondary | ICD-10-CM | POA: Diagnosis not present

## 2020-03-09 DIAGNOSIS — L821 Other seborrheic keratosis: Secondary | ICD-10-CM | POA: Diagnosis not present

## 2020-03-09 DIAGNOSIS — D225 Melanocytic nevi of trunk: Secondary | ICD-10-CM | POA: Diagnosis not present

## 2020-03-23 DIAGNOSIS — E785 Hyperlipidemia, unspecified: Secondary | ICD-10-CM | POA: Diagnosis not present

## 2020-03-23 DIAGNOSIS — N1831 Chronic kidney disease, stage 3a: Secondary | ICD-10-CM | POA: Diagnosis not present

## 2020-03-23 DIAGNOSIS — I129 Hypertensive chronic kidney disease with stage 1 through stage 4 chronic kidney disease, or unspecified chronic kidney disease: Secondary | ICD-10-CM | POA: Diagnosis not present

## 2020-03-23 DIAGNOSIS — I10 Essential (primary) hypertension: Secondary | ICD-10-CM | POA: Diagnosis not present

## 2020-03-23 DIAGNOSIS — D649 Anemia, unspecified: Secondary | ICD-10-CM | POA: Diagnosis not present

## 2020-03-23 DIAGNOSIS — E1169 Type 2 diabetes mellitus with other specified complication: Secondary | ICD-10-CM | POA: Diagnosis not present

## 2020-03-23 DIAGNOSIS — K219 Gastro-esophageal reflux disease without esophagitis: Secondary | ICD-10-CM | POA: Diagnosis not present

## 2020-03-23 DIAGNOSIS — I2510 Atherosclerotic heart disease of native coronary artery without angina pectoris: Secondary | ICD-10-CM | POA: Diagnosis not present

## 2020-03-23 DIAGNOSIS — I5041 Acute combined systolic (congestive) and diastolic (congestive) heart failure: Secondary | ICD-10-CM | POA: Diagnosis not present

## 2020-03-30 DIAGNOSIS — H353122 Nonexudative age-related macular degeneration, left eye, intermediate dry stage: Secondary | ICD-10-CM | POA: Diagnosis not present

## 2020-03-30 DIAGNOSIS — H0102A Squamous blepharitis right eye, upper and lower eyelids: Secondary | ICD-10-CM | POA: Diagnosis not present

## 2020-03-30 DIAGNOSIS — H17822 Peripheral opacity of cornea, left eye: Secondary | ICD-10-CM | POA: Diagnosis not present

## 2020-03-30 DIAGNOSIS — Q111 Other anophthalmos: Secondary | ICD-10-CM | POA: Diagnosis not present

## 2020-03-30 DIAGNOSIS — H0102B Squamous blepharitis left eye, upper and lower eyelids: Secondary | ICD-10-CM | POA: Diagnosis not present

## 2020-04-03 DIAGNOSIS — L570 Actinic keratosis: Secondary | ICD-10-CM | POA: Diagnosis not present

## 2020-04-03 DIAGNOSIS — L720 Epidermal cyst: Secondary | ICD-10-CM | POA: Diagnosis not present

## 2020-04-03 DIAGNOSIS — C4442 Squamous cell carcinoma of skin of scalp and neck: Secondary | ICD-10-CM | POA: Diagnosis not present

## 2020-04-03 DIAGNOSIS — D485 Neoplasm of uncertain behavior of skin: Secondary | ICD-10-CM | POA: Diagnosis not present

## 2020-04-04 DIAGNOSIS — Z008 Encounter for other general examination: Secondary | ICD-10-CM | POA: Diagnosis not present

## 2020-04-04 DIAGNOSIS — E785 Hyperlipidemia, unspecified: Secondary | ICD-10-CM | POA: Diagnosis not present

## 2020-04-04 DIAGNOSIS — I110 Hypertensive heart disease with heart failure: Secondary | ICD-10-CM | POA: Diagnosis not present

## 2020-04-04 DIAGNOSIS — I252 Old myocardial infarction: Secondary | ICD-10-CM | POA: Diagnosis not present

## 2020-04-04 DIAGNOSIS — K219 Gastro-esophageal reflux disease without esophagitis: Secondary | ICD-10-CM | POA: Diagnosis not present

## 2020-04-04 DIAGNOSIS — H547 Unspecified visual loss: Secondary | ICD-10-CM | POA: Diagnosis not present

## 2020-04-04 DIAGNOSIS — I495 Sick sinus syndrome: Secondary | ICD-10-CM | POA: Diagnosis not present

## 2020-04-04 DIAGNOSIS — G546 Phantom limb syndrome with pain: Secondary | ICD-10-CM | POA: Diagnosis not present

## 2020-04-04 DIAGNOSIS — I2510 Atherosclerotic heart disease of native coronary artery without angina pectoris: Secondary | ICD-10-CM | POA: Diagnosis not present

## 2020-04-04 DIAGNOSIS — E119 Type 2 diabetes mellitus without complications: Secondary | ICD-10-CM | POA: Diagnosis not present

## 2020-04-04 DIAGNOSIS — I509 Heart failure, unspecified: Secondary | ICD-10-CM | POA: Diagnosis not present

## 2020-04-14 DIAGNOSIS — E1169 Type 2 diabetes mellitus with other specified complication: Secondary | ICD-10-CM | POA: Diagnosis not present

## 2020-04-14 DIAGNOSIS — E785 Hyperlipidemia, unspecified: Secondary | ICD-10-CM | POA: Diagnosis not present

## 2020-04-14 DIAGNOSIS — I10 Essential (primary) hypertension: Secondary | ICD-10-CM | POA: Diagnosis not present

## 2020-04-14 DIAGNOSIS — I5041 Acute combined systolic (congestive) and diastolic (congestive) heart failure: Secondary | ICD-10-CM | POA: Diagnosis not present

## 2020-04-14 DIAGNOSIS — K219 Gastro-esophageal reflux disease without esophagitis: Secondary | ICD-10-CM | POA: Diagnosis not present

## 2020-04-14 DIAGNOSIS — N1831 Chronic kidney disease, stage 3a: Secondary | ICD-10-CM | POA: Diagnosis not present

## 2020-04-14 DIAGNOSIS — I2510 Atherosclerotic heart disease of native coronary artery without angina pectoris: Secondary | ICD-10-CM | POA: Diagnosis not present

## 2020-04-14 DIAGNOSIS — I129 Hypertensive chronic kidney disease with stage 1 through stage 4 chronic kidney disease, or unspecified chronic kidney disease: Secondary | ICD-10-CM | POA: Diagnosis not present

## 2020-04-14 DIAGNOSIS — D649 Anemia, unspecified: Secondary | ICD-10-CM | POA: Diagnosis not present

## 2020-04-17 DIAGNOSIS — E559 Vitamin D deficiency, unspecified: Secondary | ICD-10-CM | POA: Diagnosis not present

## 2020-04-17 DIAGNOSIS — E7210 Disorders of sulfur-bearing amino-acid metabolism, unspecified: Secondary | ICD-10-CM | POA: Diagnosis not present

## 2020-04-17 DIAGNOSIS — E119 Type 2 diabetes mellitus without complications: Secondary | ICD-10-CM | POA: Diagnosis not present

## 2020-04-17 DIAGNOSIS — R7989 Other specified abnormal findings of blood chemistry: Secondary | ICD-10-CM | POA: Diagnosis not present

## 2020-04-26 DIAGNOSIS — E119 Type 2 diabetes mellitus without complications: Secondary | ICD-10-CM | POA: Diagnosis not present

## 2020-05-10 DIAGNOSIS — I2510 Atherosclerotic heart disease of native coronary artery without angina pectoris: Secondary | ICD-10-CM | POA: Diagnosis not present

## 2020-05-10 DIAGNOSIS — E1169 Type 2 diabetes mellitus with other specified complication: Secondary | ICD-10-CM | POA: Diagnosis not present

## 2020-05-10 DIAGNOSIS — Z7984 Long term (current) use of oral hypoglycemic drugs: Secondary | ICD-10-CM | POA: Diagnosis not present

## 2020-05-10 DIAGNOSIS — E785 Hyperlipidemia, unspecified: Secondary | ICD-10-CM | POA: Diagnosis not present

## 2020-05-10 DIAGNOSIS — I10 Essential (primary) hypertension: Secondary | ICD-10-CM | POA: Diagnosis not present

## 2020-05-10 DIAGNOSIS — K219 Gastro-esophageal reflux disease without esophagitis: Secondary | ICD-10-CM | POA: Diagnosis not present

## 2020-05-10 DIAGNOSIS — Z7189 Other specified counseling: Secondary | ICD-10-CM | POA: Diagnosis not present

## 2020-05-10 DIAGNOSIS — Z0000 Encounter for general adult medical examination without abnormal findings: Secondary | ICD-10-CM | POA: Diagnosis not present

## 2020-05-10 DIAGNOSIS — Z1389 Encounter for screening for other disorder: Secondary | ICD-10-CM | POA: Diagnosis not present

## 2020-05-10 DIAGNOSIS — I700 Atherosclerosis of aorta: Secondary | ICD-10-CM | POA: Diagnosis not present

## 2020-05-17 DIAGNOSIS — I5041 Acute combined systolic (congestive) and diastolic (congestive) heart failure: Secondary | ICD-10-CM | POA: Diagnosis not present

## 2020-05-17 DIAGNOSIS — E785 Hyperlipidemia, unspecified: Secondary | ICD-10-CM | POA: Diagnosis not present

## 2020-05-17 DIAGNOSIS — D649 Anemia, unspecified: Secondary | ICD-10-CM | POA: Diagnosis not present

## 2020-05-17 DIAGNOSIS — K219 Gastro-esophageal reflux disease without esophagitis: Secondary | ICD-10-CM | POA: Diagnosis not present

## 2020-05-17 DIAGNOSIS — E1169 Type 2 diabetes mellitus with other specified complication: Secondary | ICD-10-CM | POA: Diagnosis not present

## 2020-05-17 DIAGNOSIS — I129 Hypertensive chronic kidney disease with stage 1 through stage 4 chronic kidney disease, or unspecified chronic kidney disease: Secondary | ICD-10-CM | POA: Diagnosis not present

## 2020-05-17 DIAGNOSIS — I2510 Atherosclerotic heart disease of native coronary artery without angina pectoris: Secondary | ICD-10-CM | POA: Diagnosis not present

## 2020-05-17 DIAGNOSIS — I10 Essential (primary) hypertension: Secondary | ICD-10-CM | POA: Diagnosis not present

## 2020-06-01 DIAGNOSIS — C4442 Squamous cell carcinoma of skin of scalp and neck: Secondary | ICD-10-CM | POA: Diagnosis not present

## 2020-06-06 DIAGNOSIS — Z950 Presence of cardiac pacemaker: Secondary | ICD-10-CM | POA: Diagnosis not present

## 2020-06-15 DIAGNOSIS — K219 Gastro-esophageal reflux disease without esophagitis: Secondary | ICD-10-CM | POA: Diagnosis not present

## 2020-06-15 DIAGNOSIS — D649 Anemia, unspecified: Secondary | ICD-10-CM | POA: Diagnosis not present

## 2020-06-15 DIAGNOSIS — I129 Hypertensive chronic kidney disease with stage 1 through stage 4 chronic kidney disease, or unspecified chronic kidney disease: Secondary | ICD-10-CM | POA: Diagnosis not present

## 2020-06-15 DIAGNOSIS — E785 Hyperlipidemia, unspecified: Secondary | ICD-10-CM | POA: Diagnosis not present

## 2020-06-15 DIAGNOSIS — I10 Essential (primary) hypertension: Secondary | ICD-10-CM | POA: Diagnosis not present

## 2020-06-15 DIAGNOSIS — N1831 Chronic kidney disease, stage 3a: Secondary | ICD-10-CM | POA: Diagnosis not present

## 2020-06-15 DIAGNOSIS — I2510 Atherosclerotic heart disease of native coronary artery without angina pectoris: Secondary | ICD-10-CM | POA: Diagnosis not present

## 2020-06-15 DIAGNOSIS — L720 Epidermal cyst: Secondary | ICD-10-CM | POA: Diagnosis not present

## 2020-06-15 DIAGNOSIS — E1169 Type 2 diabetes mellitus with other specified complication: Secondary | ICD-10-CM | POA: Diagnosis not present

## 2020-06-15 DIAGNOSIS — I5041 Acute combined systolic (congestive) and diastolic (congestive) heart failure: Secondary | ICD-10-CM | POA: Diagnosis not present

## 2020-07-24 DIAGNOSIS — E559 Vitamin D deficiency, unspecified: Secondary | ICD-10-CM | POA: Diagnosis not present

## 2020-07-24 DIAGNOSIS — E7210 Disorders of sulfur-bearing amino-acid metabolism, unspecified: Secondary | ICD-10-CM | POA: Diagnosis not present

## 2020-07-24 DIAGNOSIS — E119 Type 2 diabetes mellitus without complications: Secondary | ICD-10-CM | POA: Diagnosis not present

## 2020-07-24 DIAGNOSIS — R7989 Other specified abnormal findings of blood chemistry: Secondary | ICD-10-CM | POA: Diagnosis not present

## 2020-07-24 DIAGNOSIS — R5383 Other fatigue: Secondary | ICD-10-CM | POA: Diagnosis not present

## 2020-07-24 DIAGNOSIS — I7091 Generalized atherosclerosis: Secondary | ICD-10-CM | POA: Diagnosis not present

## 2020-07-31 DIAGNOSIS — E119 Type 2 diabetes mellitus without complications: Secondary | ICD-10-CM | POA: Diagnosis not present

## 2020-08-08 DIAGNOSIS — E785 Hyperlipidemia, unspecified: Secondary | ICD-10-CM | POA: Diagnosis not present

## 2020-08-08 DIAGNOSIS — E1169 Type 2 diabetes mellitus with other specified complication: Secondary | ICD-10-CM | POA: Diagnosis not present

## 2020-08-08 DIAGNOSIS — N1831 Chronic kidney disease, stage 3a: Secondary | ICD-10-CM | POA: Diagnosis not present

## 2020-08-08 DIAGNOSIS — D649 Anemia, unspecified: Secondary | ICD-10-CM | POA: Diagnosis not present

## 2020-08-08 DIAGNOSIS — I5041 Acute combined systolic (congestive) and diastolic (congestive) heart failure: Secondary | ICD-10-CM | POA: Diagnosis not present

## 2020-08-08 DIAGNOSIS — I2510 Atherosclerotic heart disease of native coronary artery without angina pectoris: Secondary | ICD-10-CM | POA: Diagnosis not present

## 2020-08-08 DIAGNOSIS — I10 Essential (primary) hypertension: Secondary | ICD-10-CM | POA: Diagnosis not present

## 2020-08-08 DIAGNOSIS — K219 Gastro-esophageal reflux disease without esophagitis: Secondary | ICD-10-CM | POA: Diagnosis not present

## 2020-08-08 DIAGNOSIS — I129 Hypertensive chronic kidney disease with stage 1 through stage 4 chronic kidney disease, or unspecified chronic kidney disease: Secondary | ICD-10-CM | POA: Diagnosis not present

## 2020-08-11 DIAGNOSIS — Z950 Presence of cardiac pacemaker: Secondary | ICD-10-CM | POA: Diagnosis not present

## 2020-08-11 DIAGNOSIS — R5381 Other malaise: Secondary | ICD-10-CM | POA: Diagnosis not present

## 2020-08-11 DIAGNOSIS — I110 Hypertensive heart disease with heart failure: Secondary | ICD-10-CM | POA: Diagnosis not present

## 2020-08-11 DIAGNOSIS — I459 Conduction disorder, unspecified: Secondary | ICD-10-CM | POA: Diagnosis not present

## 2020-08-11 DIAGNOSIS — I498 Other specified cardiac arrhythmias: Secondary | ICD-10-CM | POA: Diagnosis not present

## 2020-08-11 DIAGNOSIS — E785 Hyperlipidemia, unspecified: Secondary | ICD-10-CM | POA: Diagnosis not present

## 2020-08-11 DIAGNOSIS — I5032 Chronic diastolic (congestive) heart failure: Secondary | ICD-10-CM | POA: Diagnosis not present

## 2020-08-15 DIAGNOSIS — D649 Anemia, unspecified: Secondary | ICD-10-CM | POA: Diagnosis not present

## 2020-08-15 DIAGNOSIS — E785 Hyperlipidemia, unspecified: Secondary | ICD-10-CM | POA: Diagnosis not present

## 2020-08-15 DIAGNOSIS — I2510 Atherosclerotic heart disease of native coronary artery without angina pectoris: Secondary | ICD-10-CM | POA: Diagnosis not present

## 2020-08-15 DIAGNOSIS — E1169 Type 2 diabetes mellitus with other specified complication: Secondary | ICD-10-CM | POA: Diagnosis not present

## 2020-08-15 DIAGNOSIS — K219 Gastro-esophageal reflux disease without esophagitis: Secondary | ICD-10-CM | POA: Diagnosis not present

## 2020-08-15 DIAGNOSIS — N1831 Chronic kidney disease, stage 3a: Secondary | ICD-10-CM | POA: Diagnosis not present

## 2020-08-15 DIAGNOSIS — I129 Hypertensive chronic kidney disease with stage 1 through stage 4 chronic kidney disease, or unspecified chronic kidney disease: Secondary | ICD-10-CM | POA: Diagnosis not present

## 2020-08-15 DIAGNOSIS — I10 Essential (primary) hypertension: Secondary | ICD-10-CM | POA: Diagnosis not present

## 2020-08-15 DIAGNOSIS — I5041 Acute combined systolic (congestive) and diastolic (congestive) heart failure: Secondary | ICD-10-CM | POA: Diagnosis not present

## 2020-08-28 DIAGNOSIS — H0102B Squamous blepharitis left eye, upper and lower eyelids: Secondary | ICD-10-CM | POA: Diagnosis not present

## 2020-08-28 DIAGNOSIS — H0102A Squamous blepharitis right eye, upper and lower eyelids: Secondary | ICD-10-CM | POA: Diagnosis not present

## 2020-08-28 DIAGNOSIS — H17822 Peripheral opacity of cornea, left eye: Secondary | ICD-10-CM | POA: Diagnosis not present

## 2020-08-28 DIAGNOSIS — H353122 Nonexudative age-related macular degeneration, left eye, intermediate dry stage: Secondary | ICD-10-CM | POA: Diagnosis not present

## 2020-08-28 DIAGNOSIS — Q111 Other anophthalmos: Secondary | ICD-10-CM | POA: Diagnosis not present

## 2020-09-05 DIAGNOSIS — Z45018 Encounter for adjustment and management of other part of cardiac pacemaker: Secondary | ICD-10-CM | POA: Diagnosis not present

## 2020-09-08 DIAGNOSIS — D649 Anemia, unspecified: Secondary | ICD-10-CM | POA: Diagnosis not present

## 2020-09-08 DIAGNOSIS — I5041 Acute combined systolic (congestive) and diastolic (congestive) heart failure: Secondary | ICD-10-CM | POA: Diagnosis not present

## 2020-09-08 DIAGNOSIS — N1831 Chronic kidney disease, stage 3a: Secondary | ICD-10-CM | POA: Diagnosis not present

## 2020-09-08 DIAGNOSIS — I2510 Atherosclerotic heart disease of native coronary artery without angina pectoris: Secondary | ICD-10-CM | POA: Diagnosis not present

## 2020-09-08 DIAGNOSIS — K219 Gastro-esophageal reflux disease without esophagitis: Secondary | ICD-10-CM | POA: Diagnosis not present

## 2020-09-08 DIAGNOSIS — I129 Hypertensive chronic kidney disease with stage 1 through stage 4 chronic kidney disease, or unspecified chronic kidney disease: Secondary | ICD-10-CM | POA: Diagnosis not present

## 2020-09-08 DIAGNOSIS — E785 Hyperlipidemia, unspecified: Secondary | ICD-10-CM | POA: Diagnosis not present

## 2020-09-08 DIAGNOSIS — I10 Essential (primary) hypertension: Secondary | ICD-10-CM | POA: Diagnosis not present

## 2020-09-08 DIAGNOSIS — E1169 Type 2 diabetes mellitus with other specified complication: Secondary | ICD-10-CM | POA: Diagnosis not present

## 2020-09-09 DIAGNOSIS — D696 Thrombocytopenia, unspecified: Secondary | ICD-10-CM | POA: Diagnosis not present

## 2020-09-09 DIAGNOSIS — R051 Acute cough: Secondary | ICD-10-CM | POA: Diagnosis not present

## 2020-09-09 DIAGNOSIS — R509 Fever, unspecified: Secondary | ICD-10-CM | POA: Diagnosis not present

## 2020-09-09 DIAGNOSIS — N189 Chronic kidney disease, unspecified: Secondary | ICD-10-CM | POA: Diagnosis not present

## 2020-09-09 DIAGNOSIS — U071 COVID-19: Secondary | ICD-10-CM | POA: Diagnosis not present

## 2020-09-09 DIAGNOSIS — R059 Cough, unspecified: Secondary | ICD-10-CM | POA: Diagnosis not present

## 2020-09-09 DIAGNOSIS — D631 Anemia in chronic kidney disease: Secondary | ICD-10-CM | POA: Diagnosis not present

## 2020-09-10 DIAGNOSIS — R051 Acute cough: Secondary | ICD-10-CM | POA: Diagnosis not present

## 2020-09-12 DIAGNOSIS — R051 Acute cough: Secondary | ICD-10-CM | POA: Diagnosis not present

## 2020-09-21 DIAGNOSIS — H903 Sensorineural hearing loss, bilateral: Secondary | ICD-10-CM | POA: Diagnosis not present

## 2020-09-21 DIAGNOSIS — H6123 Impacted cerumen, bilateral: Secondary | ICD-10-CM | POA: Diagnosis not present

## 2020-09-26 DIAGNOSIS — L989 Disorder of the skin and subcutaneous tissue, unspecified: Secondary | ICD-10-CM | POA: Diagnosis not present

## 2020-09-26 DIAGNOSIS — D045 Carcinoma in situ of skin of trunk: Secondary | ICD-10-CM | POA: Diagnosis not present

## 2020-09-26 DIAGNOSIS — L821 Other seborrheic keratosis: Secondary | ICD-10-CM | POA: Diagnosis not present

## 2020-09-26 DIAGNOSIS — D225 Melanocytic nevi of trunk: Secondary | ICD-10-CM | POA: Diagnosis not present

## 2020-09-26 DIAGNOSIS — Z85828 Personal history of other malignant neoplasm of skin: Secondary | ICD-10-CM | POA: Diagnosis not present

## 2020-09-26 DIAGNOSIS — L578 Other skin changes due to chronic exposure to nonionizing radiation: Secondary | ICD-10-CM | POA: Diagnosis not present

## 2020-09-26 DIAGNOSIS — L570 Actinic keratosis: Secondary | ICD-10-CM | POA: Diagnosis not present

## 2020-09-26 DIAGNOSIS — D485 Neoplasm of uncertain behavior of skin: Secondary | ICD-10-CM | POA: Diagnosis not present

## 2020-10-02 DIAGNOSIS — I10 Essential (primary) hypertension: Secondary | ICD-10-CM | POA: Diagnosis not present

## 2020-10-02 DIAGNOSIS — N1831 Chronic kidney disease, stage 3a: Secondary | ICD-10-CM | POA: Diagnosis not present

## 2020-10-02 DIAGNOSIS — E1169 Type 2 diabetes mellitus with other specified complication: Secondary | ICD-10-CM | POA: Diagnosis not present

## 2020-10-02 DIAGNOSIS — U071 COVID-19: Secondary | ICD-10-CM | POA: Diagnosis not present

## 2020-10-02 DIAGNOSIS — E785 Hyperlipidemia, unspecified: Secondary | ICD-10-CM | POA: Diagnosis not present

## 2020-10-02 DIAGNOSIS — B349 Viral infection, unspecified: Secondary | ICD-10-CM | POA: Diagnosis not present

## 2020-10-10 DIAGNOSIS — E785 Hyperlipidemia, unspecified: Secondary | ICD-10-CM | POA: Diagnosis not present

## 2020-10-10 DIAGNOSIS — I5041 Acute combined systolic (congestive) and diastolic (congestive) heart failure: Secondary | ICD-10-CM | POA: Diagnosis not present

## 2020-10-10 DIAGNOSIS — N1831 Chronic kidney disease, stage 3a: Secondary | ICD-10-CM | POA: Diagnosis not present

## 2020-10-10 DIAGNOSIS — I10 Essential (primary) hypertension: Secondary | ICD-10-CM | POA: Diagnosis not present

## 2020-10-10 DIAGNOSIS — D649 Anemia, unspecified: Secondary | ICD-10-CM | POA: Diagnosis not present

## 2020-10-10 DIAGNOSIS — K219 Gastro-esophageal reflux disease without esophagitis: Secondary | ICD-10-CM | POA: Diagnosis not present

## 2020-10-10 DIAGNOSIS — I129 Hypertensive chronic kidney disease with stage 1 through stage 4 chronic kidney disease, or unspecified chronic kidney disease: Secondary | ICD-10-CM | POA: Diagnosis not present

## 2020-10-10 DIAGNOSIS — E1169 Type 2 diabetes mellitus with other specified complication: Secondary | ICD-10-CM | POA: Diagnosis not present

## 2020-10-10 DIAGNOSIS — I2510 Atherosclerotic heart disease of native coronary artery without angina pectoris: Secondary | ICD-10-CM | POA: Diagnosis not present

## 2020-10-19 ENCOUNTER — Encounter (INDEPENDENT_AMBULATORY_CARE_PROVIDER_SITE_OTHER): Payer: Medicare HMO | Admitting: Ophthalmology

## 2020-10-31 DIAGNOSIS — E782 Mixed hyperlipidemia: Secondary | ICD-10-CM | POA: Diagnosis not present

## 2020-10-31 DIAGNOSIS — L570 Actinic keratosis: Secondary | ICD-10-CM | POA: Diagnosis not present

## 2020-10-31 DIAGNOSIS — E559 Vitamin D deficiency, unspecified: Secondary | ICD-10-CM | POA: Diagnosis not present

## 2020-10-31 DIAGNOSIS — C4449 Other specified malignant neoplasm of skin of scalp and neck: Secondary | ICD-10-CM | POA: Diagnosis not present

## 2020-10-31 DIAGNOSIS — E119 Type 2 diabetes mellitus without complications: Secondary | ICD-10-CM | POA: Diagnosis not present

## 2020-10-31 DIAGNOSIS — I10 Essential (primary) hypertension: Secondary | ICD-10-CM | POA: Diagnosis not present

## 2020-11-06 DIAGNOSIS — E119 Type 2 diabetes mellitus without complications: Secondary | ICD-10-CM | POA: Diagnosis not present

## 2020-12-05 DIAGNOSIS — I459 Conduction disorder, unspecified: Secondary | ICD-10-CM | POA: Diagnosis not present

## 2020-12-05 DIAGNOSIS — Z45018 Encounter for adjustment and management of other part of cardiac pacemaker: Secondary | ICD-10-CM | POA: Diagnosis not present

## 2020-12-07 DIAGNOSIS — E1169 Type 2 diabetes mellitus with other specified complication: Secondary | ICD-10-CM | POA: Diagnosis not present

## 2020-12-07 DIAGNOSIS — N1831 Chronic kidney disease, stage 3a: Secondary | ICD-10-CM | POA: Diagnosis not present

## 2020-12-07 DIAGNOSIS — I129 Hypertensive chronic kidney disease with stage 1 through stage 4 chronic kidney disease, or unspecified chronic kidney disease: Secondary | ICD-10-CM | POA: Diagnosis not present

## 2020-12-07 DIAGNOSIS — I5041 Acute combined systolic (congestive) and diastolic (congestive) heart failure: Secondary | ICD-10-CM | POA: Diagnosis not present

## 2020-12-07 DIAGNOSIS — E785 Hyperlipidemia, unspecified: Secondary | ICD-10-CM | POA: Diagnosis not present

## 2020-12-07 DIAGNOSIS — I2510 Atherosclerotic heart disease of native coronary artery without angina pectoris: Secondary | ICD-10-CM | POA: Diagnosis not present

## 2020-12-07 DIAGNOSIS — I10 Essential (primary) hypertension: Secondary | ICD-10-CM | POA: Diagnosis not present

## 2020-12-07 DIAGNOSIS — K219 Gastro-esophageal reflux disease without esophagitis: Secondary | ICD-10-CM | POA: Diagnosis not present

## 2020-12-26 DIAGNOSIS — L570 Actinic keratosis: Secondary | ICD-10-CM | POA: Diagnosis not present

## 2020-12-26 DIAGNOSIS — L723 Sebaceous cyst: Secondary | ICD-10-CM | POA: Diagnosis not present

## 2021-01-05 DIAGNOSIS — K219 Gastro-esophageal reflux disease without esophagitis: Secondary | ICD-10-CM | POA: Diagnosis not present

## 2021-01-05 DIAGNOSIS — I129 Hypertensive chronic kidney disease with stage 1 through stage 4 chronic kidney disease, or unspecified chronic kidney disease: Secondary | ICD-10-CM | POA: Diagnosis not present

## 2021-01-05 DIAGNOSIS — I2510 Atherosclerotic heart disease of native coronary artery without angina pectoris: Secondary | ICD-10-CM | POA: Diagnosis not present

## 2021-01-05 DIAGNOSIS — I10 Essential (primary) hypertension: Secondary | ICD-10-CM | POA: Diagnosis not present

## 2021-01-05 DIAGNOSIS — D649 Anemia, unspecified: Secondary | ICD-10-CM | POA: Diagnosis not present

## 2021-01-05 DIAGNOSIS — E785 Hyperlipidemia, unspecified: Secondary | ICD-10-CM | POA: Diagnosis not present

## 2021-01-05 DIAGNOSIS — I5041 Acute combined systolic (congestive) and diastolic (congestive) heart failure: Secondary | ICD-10-CM | POA: Diagnosis not present

## 2021-01-05 DIAGNOSIS — E1169 Type 2 diabetes mellitus with other specified complication: Secondary | ICD-10-CM | POA: Diagnosis not present

## 2021-01-05 DIAGNOSIS — N1831 Chronic kidney disease, stage 3a: Secondary | ICD-10-CM | POA: Diagnosis not present

## 2021-01-09 DIAGNOSIS — R350 Frequency of micturition: Secondary | ICD-10-CM | POA: Diagnosis not present

## 2021-01-09 DIAGNOSIS — K219 Gastro-esophageal reflux disease without esophagitis: Secondary | ICD-10-CM | POA: Diagnosis not present

## 2021-01-09 DIAGNOSIS — N1831 Chronic kidney disease, stage 3a: Secondary | ICD-10-CM | POA: Diagnosis not present

## 2021-01-09 DIAGNOSIS — E1169 Type 2 diabetes mellitus with other specified complication: Secondary | ICD-10-CM | POA: Diagnosis not present

## 2021-02-05 DIAGNOSIS — E782 Mixed hyperlipidemia: Secondary | ICD-10-CM | POA: Diagnosis not present

## 2021-02-05 DIAGNOSIS — E7210 Disorders of sulfur-bearing amino-acid metabolism, unspecified: Secondary | ICD-10-CM | POA: Diagnosis not present

## 2021-02-05 DIAGNOSIS — I10 Essential (primary) hypertension: Secondary | ICD-10-CM | POA: Diagnosis not present

## 2021-02-05 DIAGNOSIS — E119 Type 2 diabetes mellitus without complications: Secondary | ICD-10-CM | POA: Diagnosis not present

## 2021-02-05 DIAGNOSIS — E559 Vitamin D deficiency, unspecified: Secondary | ICD-10-CM | POA: Diagnosis not present

## 2021-02-07 DIAGNOSIS — E785 Hyperlipidemia, unspecified: Secondary | ICD-10-CM | POA: Diagnosis not present

## 2021-02-07 DIAGNOSIS — D649 Anemia, unspecified: Secondary | ICD-10-CM | POA: Diagnosis not present

## 2021-02-07 DIAGNOSIS — I129 Hypertensive chronic kidney disease with stage 1 through stage 4 chronic kidney disease, or unspecified chronic kidney disease: Secondary | ICD-10-CM | POA: Diagnosis not present

## 2021-02-07 DIAGNOSIS — K219 Gastro-esophageal reflux disease without esophagitis: Secondary | ICD-10-CM | POA: Diagnosis not present

## 2021-02-07 DIAGNOSIS — E1169 Type 2 diabetes mellitus with other specified complication: Secondary | ICD-10-CM | POA: Diagnosis not present

## 2021-02-07 DIAGNOSIS — I2510 Atherosclerotic heart disease of native coronary artery without angina pectoris: Secondary | ICD-10-CM | POA: Diagnosis not present

## 2021-02-07 DIAGNOSIS — N1831 Chronic kidney disease, stage 3a: Secondary | ICD-10-CM | POA: Diagnosis not present

## 2021-02-07 DIAGNOSIS — I10 Essential (primary) hypertension: Secondary | ICD-10-CM | POA: Diagnosis not present

## 2021-02-07 DIAGNOSIS — I5041 Acute combined systolic (congestive) and diastolic (congestive) heart failure: Secondary | ICD-10-CM | POA: Diagnosis not present

## 2021-02-08 DIAGNOSIS — T8149XA Infection following a procedure, other surgical site, initial encounter: Secondary | ICD-10-CM | POA: Diagnosis not present

## 2021-02-09 DIAGNOSIS — Z4801 Encounter for change or removal of surgical wound dressing: Secondary | ICD-10-CM | POA: Diagnosis not present

## 2021-02-09 DIAGNOSIS — Z48817 Encounter for surgical aftercare following surgery on the skin and subcutaneous tissue: Secondary | ICD-10-CM | POA: Diagnosis not present

## 2021-02-13 DIAGNOSIS — E119 Type 2 diabetes mellitus without complications: Secondary | ICD-10-CM | POA: Diagnosis not present

## 2021-02-15 DIAGNOSIS — T8149XA Infection following a procedure, other surgical site, initial encounter: Secondary | ICD-10-CM | POA: Diagnosis not present

## 2021-02-19 DIAGNOSIS — H35372 Puckering of macula, left eye: Secondary | ICD-10-CM | POA: Diagnosis not present

## 2021-02-19 DIAGNOSIS — H0102A Squamous blepharitis right eye, upper and lower eyelids: Secondary | ICD-10-CM | POA: Diagnosis not present

## 2021-02-19 DIAGNOSIS — Q111 Other anophthalmos: Secondary | ICD-10-CM | POA: Diagnosis not present

## 2021-02-19 DIAGNOSIS — H353122 Nonexudative age-related macular degeneration, left eye, intermediate dry stage: Secondary | ICD-10-CM | POA: Diagnosis not present

## 2021-02-19 DIAGNOSIS — E119 Type 2 diabetes mellitus without complications: Secondary | ICD-10-CM | POA: Diagnosis not present

## 2021-02-19 DIAGNOSIS — H0102B Squamous blepharitis left eye, upper and lower eyelids: Secondary | ICD-10-CM | POA: Diagnosis not present

## 2021-02-19 DIAGNOSIS — H17822 Peripheral opacity of cornea, left eye: Secondary | ICD-10-CM | POA: Diagnosis not present

## 2021-02-21 DIAGNOSIS — S0100XA Unspecified open wound of scalp, initial encounter: Secondary | ICD-10-CM | POA: Diagnosis not present

## 2021-02-28 DIAGNOSIS — W19XXXA Unspecified fall, initial encounter: Secondary | ICD-10-CM | POA: Diagnosis not present

## 2021-02-28 DIAGNOSIS — I129 Hypertensive chronic kidney disease with stage 1 through stage 4 chronic kidney disease, or unspecified chronic kidney disease: Secondary | ICD-10-CM | POA: Diagnosis not present

## 2021-02-28 DIAGNOSIS — G44209 Tension-type headache, unspecified, not intractable: Secondary | ICD-10-CM | POA: Diagnosis not present

## 2021-03-09 DIAGNOSIS — D649 Anemia, unspecified: Secondary | ICD-10-CM | POA: Diagnosis not present

## 2021-03-09 DIAGNOSIS — N1831 Chronic kidney disease, stage 3a: Secondary | ICD-10-CM | POA: Diagnosis not present

## 2021-03-09 DIAGNOSIS — I10 Essential (primary) hypertension: Secondary | ICD-10-CM | POA: Diagnosis not present

## 2021-03-09 DIAGNOSIS — E1169 Type 2 diabetes mellitus with other specified complication: Secondary | ICD-10-CM | POA: Diagnosis not present

## 2021-03-09 DIAGNOSIS — E785 Hyperlipidemia, unspecified: Secondary | ICD-10-CM | POA: Diagnosis not present

## 2021-03-09 DIAGNOSIS — I2510 Atherosclerotic heart disease of native coronary artery without angina pectoris: Secondary | ICD-10-CM | POA: Diagnosis not present

## 2021-03-09 DIAGNOSIS — I129 Hypertensive chronic kidney disease with stage 1 through stage 4 chronic kidney disease, or unspecified chronic kidney disease: Secondary | ICD-10-CM | POA: Diagnosis not present

## 2021-03-09 DIAGNOSIS — K219 Gastro-esophageal reflux disease without esophagitis: Secondary | ICD-10-CM | POA: Diagnosis not present

## 2021-03-09 DIAGNOSIS — I5041 Acute combined systolic (congestive) and diastolic (congestive) heart failure: Secondary | ICD-10-CM | POA: Diagnosis not present

## 2021-03-20 DIAGNOSIS — M1990 Unspecified osteoarthritis, unspecified site: Secondary | ICD-10-CM | POA: Diagnosis not present

## 2021-03-20 DIAGNOSIS — R69 Illness, unspecified: Secondary | ICD-10-CM | POA: Diagnosis not present

## 2021-03-20 DIAGNOSIS — I495 Sick sinus syndrome: Secondary | ICD-10-CM | POA: Diagnosis not present

## 2021-03-20 DIAGNOSIS — K219 Gastro-esophageal reflux disease without esophagitis: Secondary | ICD-10-CM | POA: Diagnosis not present

## 2021-03-20 DIAGNOSIS — N529 Male erectile dysfunction, unspecified: Secondary | ICD-10-CM | POA: Diagnosis not present

## 2021-03-20 DIAGNOSIS — R2681 Unsteadiness on feet: Secondary | ICD-10-CM | POA: Diagnosis not present

## 2021-03-20 DIAGNOSIS — H547 Unspecified visual loss: Secondary | ICD-10-CM | POA: Diagnosis not present

## 2021-03-20 DIAGNOSIS — G629 Polyneuropathy, unspecified: Secondary | ICD-10-CM | POA: Diagnosis not present

## 2021-03-20 DIAGNOSIS — Z008 Encounter for other general examination: Secondary | ICD-10-CM | POA: Diagnosis not present

## 2021-03-20 DIAGNOSIS — I10 Essential (primary) hypertension: Secondary | ICD-10-CM | POA: Diagnosis not present

## 2021-03-20 DIAGNOSIS — Z89212 Acquired absence of left upper limb below elbow: Secondary | ICD-10-CM | POA: Diagnosis not present

## 2021-03-20 DIAGNOSIS — Z7982 Long term (current) use of aspirin: Secondary | ICD-10-CM | POA: Diagnosis not present

## 2021-03-20 DIAGNOSIS — E785 Hyperlipidemia, unspecified: Secondary | ICD-10-CM | POA: Diagnosis not present

## 2021-03-27 DIAGNOSIS — R739 Hyperglycemia, unspecified: Secondary | ICD-10-CM | POA: Diagnosis not present

## 2021-03-27 DIAGNOSIS — S0100XS Unspecified open wound of scalp, sequela: Secondary | ICD-10-CM | POA: Diagnosis not present

## 2021-03-27 DIAGNOSIS — I10 Essential (primary) hypertension: Secondary | ICD-10-CM | POA: Diagnosis not present

## 2021-03-27 DIAGNOSIS — N1831 Chronic kidney disease, stage 3a: Secondary | ICD-10-CM | POA: Diagnosis not present

## 2021-03-27 DIAGNOSIS — R54 Age-related physical debility: Secondary | ICD-10-CM | POA: Diagnosis not present

## 2021-03-29 DIAGNOSIS — L578 Other skin changes due to chronic exposure to nonionizing radiation: Secondary | ICD-10-CM | POA: Diagnosis not present

## 2021-03-29 DIAGNOSIS — D225 Melanocytic nevi of trunk: Secondary | ICD-10-CM | POA: Diagnosis not present

## 2021-03-29 DIAGNOSIS — L821 Other seborrheic keratosis: Secondary | ICD-10-CM | POA: Diagnosis not present

## 2021-03-29 DIAGNOSIS — L570 Actinic keratosis: Secondary | ICD-10-CM | POA: Diagnosis not present

## 2021-03-29 DIAGNOSIS — Z85828 Personal history of other malignant neoplasm of skin: Secondary | ICD-10-CM | POA: Diagnosis not present

## 2021-03-29 DIAGNOSIS — Z86018 Personal history of other benign neoplasm: Secondary | ICD-10-CM | POA: Diagnosis not present

## 2021-03-30 DIAGNOSIS — S0100XA Unspecified open wound of scalp, initial encounter: Secondary | ICD-10-CM | POA: Diagnosis not present

## 2021-04-02 DIAGNOSIS — W19XXXA Unspecified fall, initial encounter: Secondary | ICD-10-CM | POA: Diagnosis not present

## 2021-04-02 DIAGNOSIS — I10 Essential (primary) hypertension: Secondary | ICD-10-CM | POA: Diagnosis not present

## 2021-04-02 DIAGNOSIS — S060X0A Concussion without loss of consciousness, initial encounter: Secondary | ICD-10-CM | POA: Diagnosis not present

## 2021-04-06 DIAGNOSIS — N1831 Chronic kidney disease, stage 3a: Secondary | ICD-10-CM | POA: Diagnosis not present

## 2021-04-06 DIAGNOSIS — I2510 Atherosclerotic heart disease of native coronary artery without angina pectoris: Secondary | ICD-10-CM | POA: Diagnosis not present

## 2021-04-06 DIAGNOSIS — D649 Anemia, unspecified: Secondary | ICD-10-CM | POA: Diagnosis not present

## 2021-04-06 DIAGNOSIS — I5041 Acute combined systolic (congestive) and diastolic (congestive) heart failure: Secondary | ICD-10-CM | POA: Diagnosis not present

## 2021-04-06 DIAGNOSIS — K219 Gastro-esophageal reflux disease without esophagitis: Secondary | ICD-10-CM | POA: Diagnosis not present

## 2021-04-06 DIAGNOSIS — I10 Essential (primary) hypertension: Secondary | ICD-10-CM | POA: Diagnosis not present

## 2021-04-06 DIAGNOSIS — E785 Hyperlipidemia, unspecified: Secondary | ICD-10-CM | POA: Diagnosis not present

## 2021-04-06 DIAGNOSIS — R54 Age-related physical debility: Secondary | ICD-10-CM | POA: Diagnosis not present

## 2021-04-06 DIAGNOSIS — I129 Hypertensive chronic kidney disease with stage 1 through stage 4 chronic kidney disease, or unspecified chronic kidney disease: Secondary | ICD-10-CM | POA: Diagnosis not present

## 2021-04-16 DIAGNOSIS — M792 Neuralgia and neuritis, unspecified: Secondary | ICD-10-CM | POA: Diagnosis not present

## 2021-04-18 DIAGNOSIS — G44209 Tension-type headache, unspecified, not intractable: Secondary | ICD-10-CM | POA: Diagnosis not present

## 2021-04-18 DIAGNOSIS — I10 Essential (primary) hypertension: Secondary | ICD-10-CM | POA: Diagnosis not present

## 2021-04-19 ENCOUNTER — Encounter: Payer: Self-pay | Admitting: Neurology

## 2021-05-07 DIAGNOSIS — I10 Essential (primary) hypertension: Secondary | ICD-10-CM | POA: Diagnosis not present

## 2021-05-07 DIAGNOSIS — E785 Hyperlipidemia, unspecified: Secondary | ICD-10-CM | POA: Diagnosis not present

## 2021-05-07 DIAGNOSIS — E1169 Type 2 diabetes mellitus with other specified complication: Secondary | ICD-10-CM | POA: Diagnosis not present

## 2021-05-07 DIAGNOSIS — S0100XA Unspecified open wound of scalp, initial encounter: Secondary | ICD-10-CM | POA: Diagnosis not present

## 2021-05-14 DIAGNOSIS — E119 Type 2 diabetes mellitus without complications: Secondary | ICD-10-CM | POA: Diagnosis not present

## 2021-05-14 DIAGNOSIS — E782 Mixed hyperlipidemia: Secondary | ICD-10-CM | POA: Diagnosis not present

## 2021-05-14 DIAGNOSIS — Z1389 Encounter for screening for other disorder: Secondary | ICD-10-CM | POA: Diagnosis not present

## 2021-05-14 DIAGNOSIS — E441 Mild protein-calorie malnutrition: Secondary | ICD-10-CM | POA: Diagnosis not present

## 2021-05-14 DIAGNOSIS — I10 Essential (primary) hypertension: Secondary | ICD-10-CM | POA: Diagnosis not present

## 2021-05-14 DIAGNOSIS — I700 Atherosclerosis of aorta: Secondary | ICD-10-CM | POA: Diagnosis not present

## 2021-05-14 DIAGNOSIS — I129 Hypertensive chronic kidney disease with stage 1 through stage 4 chronic kidney disease, or unspecified chronic kidney disease: Secondary | ICD-10-CM | POA: Diagnosis not present

## 2021-05-14 DIAGNOSIS — D649 Anemia, unspecified: Secondary | ICD-10-CM | POA: Diagnosis not present

## 2021-05-14 DIAGNOSIS — I2510 Atherosclerotic heart disease of native coronary artery without angina pectoris: Secondary | ICD-10-CM | POA: Diagnosis not present

## 2021-05-14 DIAGNOSIS — Z0000 Encounter for general adult medical examination without abnormal findings: Secondary | ICD-10-CM | POA: Diagnosis not present

## 2021-05-14 DIAGNOSIS — N1831 Chronic kidney disease, stage 3a: Secondary | ICD-10-CM | POA: Diagnosis not present

## 2021-05-14 DIAGNOSIS — E785 Hyperlipidemia, unspecified: Secondary | ICD-10-CM | POA: Diagnosis not present

## 2021-05-14 DIAGNOSIS — E559 Vitamin D deficiency, unspecified: Secondary | ICD-10-CM | POA: Diagnosis not present

## 2021-05-14 DIAGNOSIS — E7210 Disorders of sulfur-bearing amino-acid metabolism, unspecified: Secondary | ICD-10-CM | POA: Diagnosis not present

## 2021-05-14 DIAGNOSIS — R413 Other amnesia: Secondary | ICD-10-CM | POA: Diagnosis not present

## 2021-05-16 DIAGNOSIS — H5711 Ocular pain, right eye: Secondary | ICD-10-CM | POA: Diagnosis not present

## 2021-05-16 DIAGNOSIS — S0100XA Unspecified open wound of scalp, initial encounter: Secondary | ICD-10-CM | POA: Diagnosis not present

## 2021-05-16 DIAGNOSIS — L570 Actinic keratosis: Secondary | ICD-10-CM | POA: Diagnosis not present

## 2021-05-16 DIAGNOSIS — R519 Headache, unspecified: Secondary | ICD-10-CM | POA: Diagnosis not present

## 2021-05-17 DIAGNOSIS — S0100XA Unspecified open wound of scalp, initial encounter: Secondary | ICD-10-CM | POA: Diagnosis not present

## 2021-05-20 DIAGNOSIS — E119 Type 2 diabetes mellitus without complications: Secondary | ICD-10-CM | POA: Diagnosis not present

## 2021-06-04 DIAGNOSIS — S0100XA Unspecified open wound of scalp, initial encounter: Secondary | ICD-10-CM | POA: Diagnosis not present

## 2021-06-04 DIAGNOSIS — H5710 Ocular pain, unspecified eye: Secondary | ICD-10-CM | POA: Diagnosis not present
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
## 2021-06-05 ENCOUNTER — Other Ambulatory Visit (INDEPENDENT_AMBULATORY_CARE_PROVIDER_SITE_OTHER): Payer: Medicare HMO

## 2021-06-05 ENCOUNTER — Encounter: Payer: Self-pay | Admitting: Neurology

## 2021-06-05 ENCOUNTER — Other Ambulatory Visit: Payer: Self-pay

## 2021-06-05 ENCOUNTER — Ambulatory Visit: Payer: Medicare HMO | Admitting: Neurology

## 2021-06-05 VITALS — BP 140/64 | HR 82 | Ht 72.0 in | Wt 148.8 lb

## 2021-06-05 DIAGNOSIS — Z79899 Other long term (current) drug therapy: Secondary | ICD-10-CM

## 2021-06-05 DIAGNOSIS — G500 Trigeminal neuralgia: Secondary | ICD-10-CM | POA: Diagnosis not present

## 2021-06-05 MED ORDER — OXCARBAZEPINE 150 MG PO TABS: 150.0000 mg | ORAL_TABLET | Freq: Two times a day (BID) | ORAL | 5 refills | Status: AC

## 2021-06-05 NOTE — Patient Instructions (Signed)
Start oxcarbazepine '150mg'$  twice daily ?Check BMP in 1 month. ?Follow up 5 months. ?

## 2021-06-06 DIAGNOSIS — H5710 Ocular pain, unspecified eye: Secondary | ICD-10-CM | POA: Diagnosis not present

## 2021-06-06 DIAGNOSIS — E785 Hyperlipidemia, unspecified: Secondary | ICD-10-CM | POA: Diagnosis not present

## 2021-06-06 DIAGNOSIS — I10 Essential (primary) hypertension: Secondary | ICD-10-CM | POA: Diagnosis not present

## 2021-06-06 DIAGNOSIS — E1169 Type 2 diabetes mellitus with other specified complication: Secondary | ICD-10-CM | POA: Diagnosis not present

## 2021-06-06 LAB — BASIC METABOLIC PANEL
BUN/Creatinine Ratio: 21 (ref 10–24)
BUN: 22 mg/dL (ref 10–36)
CO2: 23 mmol/L (ref 20–29)
Calcium: 9.4 mg/dL (ref 8.6–10.2)
Chloride: 98 mmol/L (ref 96–106)
Creatinine, Ser: 1.05 mg/dL (ref 0.76–1.27)
Glucose: 141 mg/dL — ABNORMAL HIGH (ref 70–99)
Potassium: 3.9 mmol/L (ref 3.5–5.2)
Sodium: 135 mmol/L (ref 134–144)
eGFR: 67 mL/min/{1.73_m2} (ref >59)

## 2021-06-18 DIAGNOSIS — H5710 Ocular pain, unspecified eye: Secondary | ICD-10-CM | POA: Diagnosis not present

## 2021-06-19 DIAGNOSIS — Z950 Presence of cardiac pacemaker: Secondary | ICD-10-CM | POA: Diagnosis not present

## 2021-06-19 DIAGNOSIS — Z955 Presence of coronary angioplasty implant and graft: Secondary | ICD-10-CM | POA: Diagnosis not present

## 2021-06-19 DIAGNOSIS — E441 Mild protein-calorie malnutrition: Secondary | ICD-10-CM | POA: Diagnosis not present

## 2021-06-19 DIAGNOSIS — D631 Anemia in chronic kidney disease: Secondary | ICD-10-CM | POA: Diagnosis not present

## 2021-06-19 DIAGNOSIS — Z9181 History of falling: Secondary | ICD-10-CM | POA: Diagnosis not present

## 2021-06-19 DIAGNOSIS — I700 Atherosclerosis of aorta: Secondary | ICD-10-CM | POA: Diagnosis not present

## 2021-06-19 DIAGNOSIS — Z89112 Acquired absence of left hand: Secondary | ICD-10-CM | POA: Diagnosis not present

## 2021-06-19 DIAGNOSIS — I5040 Unspecified combined systolic (congestive) and diastolic (congestive) heart failure: Secondary | ICD-10-CM | POA: Diagnosis not present

## 2021-06-19 DIAGNOSIS — N1831 Chronic kidney disease, stage 3a: Secondary | ICD-10-CM | POA: Diagnosis not present

## 2021-06-19 DIAGNOSIS — E1122 Type 2 diabetes mellitus with diabetic chronic kidney disease: Secondary | ICD-10-CM | POA: Diagnosis not present

## 2021-06-19 DIAGNOSIS — G44209 Tension-type headache, unspecified, not intractable: Secondary | ICD-10-CM | POA: Diagnosis not present

## 2021-06-19 DIAGNOSIS — I2510 Atherosclerotic heart disease of native coronary artery without angina pectoris: Secondary | ICD-10-CM | POA: Diagnosis not present

## 2021-06-19 DIAGNOSIS — K219 Gastro-esophageal reflux disease without esophagitis: Secondary | ICD-10-CM | POA: Diagnosis not present

## 2021-06-19 DIAGNOSIS — I130 Hypertensive heart and chronic kidney disease with heart failure and stage 1 through stage 4 chronic kidney disease, or unspecified chronic kidney disease: Secondary | ICD-10-CM | POA: Diagnosis not present

## 2021-06-19 DIAGNOSIS — E785 Hyperlipidemia, unspecified: Secondary | ICD-10-CM | POA: Diagnosis not present

## 2021-06-21 DIAGNOSIS — H5710 Ocular pain, unspecified eye: Secondary | ICD-10-CM | POA: Diagnosis not present

## 2021-06-24 DIAGNOSIS — I4891 Unspecified atrial fibrillation: Secondary | ICD-10-CM | POA: Diagnosis not present

## 2021-06-24 DIAGNOSIS — I471 Supraventricular tachycardia: Secondary | ICD-10-CM | POA: Diagnosis not present

## 2021-06-24 DIAGNOSIS — Z45018 Encounter for adjustment and management of other part of cardiac pacemaker: Secondary | ICD-10-CM | POA: Diagnosis not present

## 2021-06-26 DIAGNOSIS — H5710 Ocular pain, unspecified eye: Secondary | ICD-10-CM | POA: Diagnosis not present

## 2021-06-27 DIAGNOSIS — Z91199 Patient's noncompliance with other medical treatment and regimen due to unspecified reason: Secondary | ICD-10-CM | POA: Diagnosis not present

## 2021-06-27 DIAGNOSIS — R519 Headache, unspecified: Secondary | ICD-10-CM | POA: Diagnosis not present

## 2021-07-02 DIAGNOSIS — T8189XA Other complications of procedures, not elsewhere classified, initial encounter: Secondary | ICD-10-CM | POA: Diagnosis not present

## 2021-07-02 DIAGNOSIS — H5710 Ocular pain, unspecified eye: Secondary | ICD-10-CM | POA: Diagnosis not present

## 2021-07-05 DIAGNOSIS — E785 Hyperlipidemia, unspecified: Secondary | ICD-10-CM | POA: Diagnosis not present

## 2021-07-05 DIAGNOSIS — E1169 Type 2 diabetes mellitus with other specified complication: Secondary | ICD-10-CM | POA: Diagnosis not present

## 2021-07-05 DIAGNOSIS — I10 Essential (primary) hypertension: Secondary | ICD-10-CM | POA: Diagnosis not present

## 2021-07-07 DIAGNOSIS — H5710 Ocular pain, unspecified eye: Secondary | ICD-10-CM | POA: Diagnosis not present

## 2021-07-11 DIAGNOSIS — R350 Frequency of micturition: Secondary | ICD-10-CM | POA: Diagnosis not present

## 2021-07-11 DIAGNOSIS — R634 Abnormal weight loss: Secondary | ICD-10-CM | POA: Diagnosis not present

## 2021-07-11 DIAGNOSIS — K5901 Slow transit constipation: Secondary | ICD-10-CM | POA: Diagnosis not present

## 2021-07-16 ENCOUNTER — Ambulatory Visit: Payer: Medicare HMO | Admitting: Neurology

## 2021-07-20 DIAGNOSIS — T148XXD Other injury of unspecified body region, subsequent encounter: Secondary | ICD-10-CM | POA: Diagnosis not present

## 2021-07-20 DIAGNOSIS — A4902 Methicillin resistant Staphylococcus aureus infection, unspecified site: Secondary | ICD-10-CM | POA: Diagnosis not present

## 2021-07-20 DIAGNOSIS — R519 Headache, unspecified: Secondary | ICD-10-CM | POA: Diagnosis not present

## 2021-09-08 DEATH — deceased

## 2021-09-24 ENCOUNTER — Other Ambulatory Visit (HOSPITAL_BASED_OUTPATIENT_CLINIC_OR_DEPARTMENT_OTHER): Payer: Self-pay

## 2021-11-14 ENCOUNTER — Ambulatory Visit: Payer: Medicare HMO | Admitting: Neurology
